# Patient Record
Sex: Female | Born: 1980 | Race: Black or African American | Hispanic: No | Smoking: Never smoker
Health system: Southern US, Community
[De-identification: ages and names within clinical notes are randomized; demographics above are authoritative.]

## PROBLEM LIST (undated history)

## (undated) DIAGNOSIS — N3281 Overactive bladder: Secondary | ICD-10-CM

## (undated) DIAGNOSIS — N393 Stress incontinence (female) (male): Secondary | ICD-10-CM

## (undated) DIAGNOSIS — N812 Incomplete uterovaginal prolapse: Secondary | ICD-10-CM

## (undated) DIAGNOSIS — J3089 Other allergic rhinitis: Secondary | ICD-10-CM

## (undated) DIAGNOSIS — Z803 Family history of malignant neoplasm of breast: Secondary | ICD-10-CM

## (undated) DIAGNOSIS — Z87440 Personal history of urinary (tract) infections: Secondary | ICD-10-CM

## (undated) DIAGNOSIS — F40240 Claustrophobia: Secondary | ICD-10-CM

## (undated) DIAGNOSIS — D259 Leiomyoma of uterus, unspecified: Secondary | ICD-10-CM

## (undated) DIAGNOSIS — N819 Female genital prolapse, unspecified: Secondary | ICD-10-CM

## (undated) DIAGNOSIS — N390 Urinary tract infection, site not specified: Secondary | ICD-10-CM

## (undated) HISTORY — DX: Urinary tract infection, site not specified: N39.0

---

## 2003-04-26 ENCOUNTER — Inpatient Hospital Stay (HOSPITAL_COMMUNITY): Admission: AD | Admit: 2003-04-26 | Discharge: 2003-04-26 | Payer: Self-pay | Admitting: Obstetrics and Gynecology

## 2003-11-29 ENCOUNTER — Other Ambulatory Visit: Admission: RE | Admit: 2003-11-29 | Discharge: 2003-11-29 | Payer: Self-pay | Admitting: Obstetrics and Gynecology

## 2004-05-03 ENCOUNTER — Inpatient Hospital Stay (HOSPITAL_COMMUNITY): Admission: AD | Admit: 2004-05-03 | Discharge: 2004-05-04 | Payer: Self-pay | Admitting: *Deleted

## 2004-12-08 HISTORY — PX: TUBAL LIGATION: SHX77

## 2005-01-09 ENCOUNTER — Encounter: Payer: Self-pay | Admitting: Emergency Medicine

## 2005-01-10 ENCOUNTER — Ambulatory Visit (HOSPITAL_COMMUNITY): Admission: AD | Admit: 2005-01-10 | Discharge: 2005-01-10 | Payer: Self-pay | Admitting: Obstetrics and Gynecology

## 2005-01-10 ENCOUNTER — Encounter (INDEPENDENT_AMBULATORY_CARE_PROVIDER_SITE_OTHER): Payer: Self-pay | Admitting: *Deleted

## 2005-01-10 DIAGNOSIS — Z8759 Personal history of other complications of pregnancy, childbirth and the puerperium: Secondary | ICD-10-CM

## 2005-01-10 HISTORY — DX: Personal history of other complications of pregnancy, childbirth and the puerperium: Z87.59

## 2005-01-10 HISTORY — PX: DIAGNOSTIC LAPAROSCOPY WITH REMOVAL OF ECTOPIC PREGNANCY: SHX6449

## 2005-01-22 ENCOUNTER — Other Ambulatory Visit: Admission: RE | Admit: 2005-01-22 | Discharge: 2005-01-22 | Payer: Self-pay | Admitting: Obstetrics and Gynecology

## 2006-04-26 ENCOUNTER — Inpatient Hospital Stay (HOSPITAL_COMMUNITY): Admission: AD | Admit: 2006-04-26 | Discharge: 2006-04-26 | Payer: Self-pay | Admitting: Obstetrics and Gynecology

## 2006-05-05 ENCOUNTER — Inpatient Hospital Stay (HOSPITAL_COMMUNITY): Admission: AD | Admit: 2006-05-05 | Discharge: 2006-05-07 | Payer: Self-pay | Admitting: Obstetrics and Gynecology

## 2006-05-06 ENCOUNTER — Encounter (INDEPENDENT_AMBULATORY_CARE_PROVIDER_SITE_OTHER): Payer: Self-pay | Admitting: *Deleted

## 2006-05-06 HISTORY — PX: TUBAL LIGATION: SHX77

## 2006-05-12 ENCOUNTER — Inpatient Hospital Stay (HOSPITAL_COMMUNITY): Admission: AD | Admit: 2006-05-12 | Discharge: 2006-05-14 | Payer: Self-pay | Admitting: Obstetrics and Gynecology

## 2006-06-16 ENCOUNTER — Other Ambulatory Visit: Admission: RE | Admit: 2006-06-16 | Discharge: 2006-06-16 | Payer: Self-pay | Admitting: Obstetrics and Gynecology

## 2008-08-03 ENCOUNTER — Encounter: Admission: RE | Admit: 2008-08-03 | Discharge: 2008-08-03 | Payer: Self-pay | Admitting: Obstetrics & Gynecology

## 2009-08-23 ENCOUNTER — Encounter: Admission: RE | Admit: 2009-08-23 | Discharge: 2009-08-23 | Payer: Self-pay | Admitting: Obstetrics & Gynecology

## 2010-03-22 ENCOUNTER — Emergency Department (HOSPITAL_COMMUNITY): Admission: EM | Admit: 2010-03-22 | Discharge: 2010-03-22 | Payer: Self-pay | Admitting: Emergency Medicine

## 2010-05-27 LAB — HM PAP SMEAR: HM Pap smear: NORMAL

## 2010-08-26 ENCOUNTER — Encounter: Admission: RE | Admit: 2010-08-26 | Discharge: 2010-08-26 | Payer: Self-pay | Admitting: Obstetrics & Gynecology

## 2011-04-25 NOTE — Op Note (Signed)
Jamie Meza, FORMAN             ACCOUNT NO.:  [id number]   MEDICAL RECORD NO.:  [id number]          PATIENT TYPE:  MAT   LOCATION:  MATC                          [facility]   PHYSICIAN:  Hal Morales, M.D.DATE OF BIRTH:  Jun 09, 1981   DATE OF PROCEDURE:  01/10/2005  DATE OF DISCHARGE:                                 OPERATIVE REPORT   PREOPERATIVE DIAGNOSIS:  Rule out ectopic pregnancy.   POSTOPERATIVE DIAGNOSIS:  The left tubal ectopic pregnancy, status post  tubal abortion.   PROCEDURE:  Operative laparoscopy with removal of left tubal ectopic  pregnancy and removal of hemoperitoneum.   SURGEON:  Hal Morales, M.D.   ANESTHESIA:  General orotracheal.   ESTIMATED BLOOD LOSS:  Less than 50 mL.   COMPLICATIONS:  None.   FINDINGS:  The uterus, right tube and ovary appeared within normal limits.  The left tube was dilated at the fimbriated end to approximately 3 cm.  In  the cul-de-sac was approximately 250 cc of old blood and once that blood had  been successfully removed, a 4 cm mass consistent with ectopic pregnancy was  present.   PROCEDURE:  The patient was taken to the operating room after appropriate  identification and placed on the operating table.  After the attainment of  adequate general anesthesia, the patient was placed in modified the  lithotomy position.  The abdomen, perineum and vagina were prepped with  multiple layers of Betadine. A Foley catheter was inserted into the bladder  and connected to straight drainage.  A single-tooth tenaculum was placed on  the anterior cervix.  The abdomen was draped as a sterile field.  Subumbilical and suprapubic injection of 0.25% Marcaine for total of 10 mL  was undertaken.  A subumbilical incision was made and a Veress cannula  placed through that incision into the perineal cavity.  A pneumoperitoneum  was created with 3.5 L of CO2.  The Veress cannula was removed and the  laparoscopic trocar placed  through that incision into the perineal cavity.  Incisions were made to the right and left of midline suprapubically and  laparoscopic trocars placed through those incisions into the peritoneal  cavity under direct visualization.  The above-noted findings were made and  documented.  The dilated portion of the tube was initially thought to  contain the ectopic and was therefore infiltrated with a dilute solution of  Pitressin then incised.  It was then that it was realized that this  represented a hydrosalpinx, and monopolar cautery was used to achieve  hemostasis in the cut edge.  Suction in the posterior cul-de-sac in an  attempt to remove more of the hemoperitoneum revealed a 4 cm mass in the  posterior cul-de-sac, which would be consistent with products of products of  conception and evidence of a probable tubal abortion.  This mass  was then  placed in an EndoCatch bag and removed through the subumbilical port.  Hemostasis was noted to be adequate after copious irrigation, and all  instruments were removed from the peritoneal cavity under direct  visualization.  The subumbilical incision was closed  with a fascial suture  of 0 Vicryl.  Subcuticular sutures of 3-0 Vicryl were used to close the skin  incisions on the subumbilical and suprapubic incisions.  Sterile dressings  were applied and the patient was awakened  from general anesthesia after having the tenaculum and Foley catheter  removed.  She was taken to the recovery room in satisfactory condition  having tolerated the procedure well, with sponge and instrument counts  correct.   SPECIMENS TO PATHOLOGY:  Products of conception.      VPH/MEDQ  D:  01/10/2005  T:  01/10/2005  Job:  [id number]

## 2011-04-25 NOTE — H&P (Signed)
NAME:  CABELA, PACIFICO                ACCOUNT NO.:  [id number]   MEDICAL RECORD NO.:  [id number]          PATIENT TYPE:  INP   LOCATION:  9169                          [facility]   PHYSICIAN:  Naima A. Dillard, M.D. DATE OF BIRTH:  [birth year]   DATE OF ADMISSION:  05/05/2006  DATE OF DISCHARGE:                                HISTORY & PHYSICAL   HISTORY:  This is a 30 year old gravida 5, para 2-0-3-2, at 37-5/7 weeks who  presents with rupture of membranes at 6:30 a.m. with onset of contractions  more recently.  She reports positive fetal movement.  Pregnancy has been  followed by the nurse midwife service and remarkable for:  1.  History of early cervical dilation.  2.  History of cryosurgery.  3.  Rapid labor.  4.  Family history of breast cancer.  5.  Desires BTL.   ALLERGIES:  None.   OB HISTORY:  The patient had a vaginal delivery in 1995, of a female infant  at 107 weeks gestation, weighing 6 pounds 3 ounces.  Remarkable for a 1-hour  labor.  She had an elective abortion in 2003, at [redacted] weeks gestation.  She had  a vaginal delivery in 2005, of a female infant at [redacted] weeks gestation,  weighing 7 pounds 11 ounces.  Remarkable for a 6-hour labor.  She had an  ectopic in 2006, with surgical removal.   MEDICAL HISTORY:  Remarkable for history of anemia, history of premature  cervical dilation with her first pregnancy, history of abnormal Pap.  She  also had varicella.   SURGICAL HISTORY:  Remarkable for wisdom teeth in 2001, cryosurgery in 2000.   FAMILY HISTORY:  Remarkable for grandfather with heart disease; grandmother  with diabetes, renal failure, and rheumatoid arthritis.  Mother with breast  cancer at age 39 and two aunts with breast cancer.   GENETIC HISTORY:  Remarkable for father of baby's son with an extra digit.   SOCIAL HISTORY:  The patient is married to a man who is involved and  supportive.  She is of the [employer].  She denies any alcohol,  tobacco, or  drug use.   PRENATAL LABS:  Hemoglobin 11.6, platelets 259.  Blood type A positive.  Antibody screen negative.  Sickle cell negative.  RPR nonreactive.  Rubella  immune.  Hepatitis negative.  Pap test normal.  Gonorrhea negative.  Chlamydia negative.  Cystic fibrosis negative.   HISTORY OF CURRENT PREGNANCY:  The patient entered care at [redacted] weeks  gestation.  She had an ultrasound to confirm dates at that time.  The  patient had ultrasound for anatomy at 19 weeks which was normal.  She had  Glucola which was normal at 28 weeks.  She saw Dr. Normand Sloop to discuss tubal  ligation at 32 weeks and had a group B strep which was positive at term.   ASSESSMENT:  1.  Intrauterine pregnancy at 37-5/7 weeks.  2.  Spontaneous rupture of membranes.  3.  Active labor.  4.  Group B strep positive.   PLAN:  1.  Admit to birthing  suite, Dr. Normand Sloop notified.  2.  Routine CNM orders.  3.  The patient wants epidural.  4.  Penicillin prophylaxis.      Marie L. Williams, C.N.M.      Naima A. Normand Sloop, M.D.  Electronically Signed    MLW/MEDQ  D:  05/05/2006  T:  05/05/2006  Job:  [id number]

## 2011-04-25 NOTE — Discharge Summary (Signed)
NAME:  Jamie Meza, Jamie Meza                ACCOUNT NO.:  [id number]   MEDICAL RECORD NO.:  [id number]          PATIENT TYPE:  INP   LOCATION:  9161                          [facility]   PHYSICIAN:  Dois Davenport A. Rivard, M.D. DATE OF BIRTH:  19-Jan-1981   DATE OF ADMISSION:  05/12/2006  DATE OF DISCHARGE:  05/14/2006                                 DISCHARGE SUMMARY   ADMISSION DIAGNOSES:  1.  Seven days status post spontaneous vaginal birth.  2.  Postpartum pregnancy-induced hypertension.   DISCHARGE DIAGNOSES:  1.  Nine days status post spontaneous vaginal birth.  2.  Preeclampsia.   PROCEDURE:  Magnesium sulfate therapy.   HOSPITAL COURSE:  Ms. Handley is a 30 year old gravida 5, para 3-0-2-3 at  seven days postpartum status post spontaneous vaginal birth who presented on  the evening of May 12, 2006 after the [employer] nurse had seen her that  day and taken a blood pressure of 150/98.  The patient also reported a  headache.  She had no visual symptoms or significant epigastric pain.  She  had had no history of hypertension in this or any previous pregnancy.  Her  pregnancy had been remarkable for (1) tubal ligation on May 06, 2006, (2)  spontaneous vaginal birth on May 05, 2006, (3) positive group B strep, (4)  history of cryosurgery.   PHYSICAL EXAMINATION:  VITAL SIGNS:  On admission, her blood pressures were  systolic 154 to 160 over diastolics 83 to 99.  Baseline during her pregnancy  was 110/60.  Other vital signs were stable.  Her weight was 200 pounds.  She  had been 206 on May 05, 2006.  Her physical exam was within normal limits.  EXTREMITIES:  She had 1 to 2+ edema in her lower extremities.   LABORATORY DATA:  CBC and comprehensive basic metabolic panel were within  normal limits.   HOSPITAL COURSE:  The decision was made to admit the patient for magnesium  sulfate therapy in light of her elevated blood pressure and headache.  She  did have negative urine protein on a  voided specimen at the time of  admission.  Hydrochlorothiazide 25 mg p.o. daily also begun.  A 24-hour  urine was also started.   On the morning of May 13, 2006, the patient was feeling somewhat better.  She had been on magnesium through the night.  She did have some mild  persistent frontal headache.  Urine output was essentially equal to intake  which demonstrated no significant diuresis.  Her CBC and comprehensive  metabolic panel that morning were within normal limits.  Later that evening,  24-hour urine sample reports came back showing 819 mg of protein in a 24-  hour specimen.  By the morning of May 14, 2006, the patient had maintained a  weight of 200.  She was pumping for breast milk.  Her 24-hour intake and  output balance had been -730; however, the last shift it had been +520.  Dr.  Estanislado Pandy saw the patient.  The patient was continued to be monitored during  the rest of  the day.  She did begin to diurese better that afternoon, and by  5 p.m. had had an increase in her urine output to 300 to 500 cc per hour.  Her blood pressures also had improved, with blood pressures of 118/83,  126/74, and 114/66.  She denied any headache or visual symptoms.  Dr. Estanislado Pandy  was consulted, and the decision was made to discharge the patient home.   DISCHARGE INSTRUCTIONS:  The patient is to continue to implement discharge  instructions per [facility] handout.  She also was instructed on  signs and symptoms of worsening PIH and preeclampsia.   DISCHARGE MEDICATIONS:  1.  Vicodin one to two p.o. q.3-4h. p.r.n. pain.  2.  Hydrochlorothiazide 25 mg one p.o. daily.   FOLLOW UP:  Discharge followup will occur on May 19, 2006 at [facility] at 10 a.m. for a blood pressure check with Nigel Bridgeman,  Certified Nurse Midwife.      [name], C.N.M.      Crist Fat Rivard, M.D.  Electronically Signed    VLL/MEDQ  D:  05/14/2006  T:  05/15/2006  Job:  [id number]

## 2011-04-25 NOTE — Op Note (Signed)
NAME:  Jamie Meza, CROKE                ACCOUNT NO.:  [id number]   MEDICAL RECORD NO.:  [id number]          PATIENT TYPE:  INP   LOCATION:  9106                          [facility]   PHYSICIAN:  Crist Fat. Rivard, M.D. DATE OF BIRTH:  09-12-1981   DATE OF PROCEDURE:  05/06/2006  DATE OF DISCHARGE:                                 OPERATIVE REPORT   PREOPERATIVE DIAGNOSIS:  Desire for sterilization.   POSTOPERATIVE DIAGNOSIS:  Desire for sterilization.   ANESTHESIA:  Epidural.   PROCEDURE:  Postpartum bilateral tubal ligation.   SURGEON:  Crist Fat. Rivard, M.D.   ESTIMATED BLOOD LOSS:  Minimal.   PROCEDURE:  After being informed of the planned procedure with possible  complications including bleeding, infection, injury to other organs,  irreversibility and failure rate of 1 in 500, informed consent is obtained.  The patient is taken to OR #8 and given epidural anesthesia with the  previously-placed epidural catheter.  She is prepped and draped in a sterile  fashion and her bladder is emptied with an in-and-out Foley catheter.  After  assessing adequate level of anesthesia, we infiltrate the umbilical area  using 10 mL of Marcaine 0.25% and we removed the previous umbilical  incision, which was a keloid, sharply.  This is brought down to the fascia  and the fascia is opened bluntly.  We are in the abdominal cavity.  We  identify each tube and tie each tube the same way.  The tube is grasped with  a Babcock forceps until fimbriae are exteriorized.  Mesosalpinx is entered  with cautery.  Proximal stump is doubly ligated with chromic, distal stump  is doubly ligated with 0 chromic.  A section of 1.5 cm of isthmic-ampullary  tube is removed and both stumps are then cauterized.  Hemostasis is checked  and adequate.  The fascia is then closed with a running suture of 0 Vicryl  and skin is closed with subcuticular suture of 3-0 Monocryl and Steri-  Strips.  Instrument and sponge count is  complete x2.  Estimated blood loss  is minimal.  The procedure is very well-tolerated by the patient, who is  taken to recovery room in a well and stable condition.      Crist Fat Rivard, M.D.  Electronically Signed     SAR/MEDQ  D:  05/06/2006  T:  05/06/2006  Job:  [id number]

## 2011-04-25 NOTE — H&P (Signed)
Jamie Meza, Jamie Meza                         ACCOUNT NO.:  [id number]   MEDICAL RECORD NO.:  [id number]                   PATIENT TYPE:  INP   LOCATION:  9176                                 [facility]   PHYSICIAN:  Hal Morales, M.D.             DATE OF BIRTH:  18-Mar-1981   DATE OF ADMISSION:  05/03/2004  DATE OF DISCHARGE:                                HISTORY & PHYSICAL   Ms. Jamie Meza is a 30 year old gravida 4, para 1, 0 2 1 at 61 and 4/7 weeks  who presents with uterine contractions every 3-6 minutes for several hours.  She denies leaking or bleeding and reports positive fetal movement.  Pregnancy has been remarkable for:  1. History of preterm labor with 1995 pregnancy, dilated to 6 cm at 32 weeks     with term delivery.  2. Two TABs but only one identified on her [facility] records, 2     identified on her women's health record.  3. History of cryosurgery.  4. History of rapid labor.  5. Strong family history of breast cancer.  6. Slightly late to care at 17 weeks at [facility], but had had one     visit at [facility] at 11 weeks.  7. Equivocal rubella.    PRENATAL LABORATORY STUDIES:  Blood type is A positive.  Rh antibody  negative.  VDRL non-reactive.  Rubella equivocal.  Hepatitis B surface  antigen negative.  HIV non-reactive.  Sickle cell test negative.  GC and  Chlamydia cultures were negative.  Pap was normal.  Glucose challenge was  normal.  AFP was normal.  Cystic fibrosis testing was negative.  Hemoglobin  upon entry into practice was 10.4.  It was 9.9 at 26 weeks.  EDC of  05/06/2004 was established by last menstrual period and was in agreement  with ultrasound at approximately 18 weeks.   HISTORY OF PRESENT PREGNANCY:  The patient entered care at [facility] at 17 weeks.  She had had one previous visit at [facility].  She had  had a history of early cervical dilatation with her first pregnancy with  dilatation to 6  cm at 32 weeks.  Therefore, after 24 weeks her cervix was  checked more frequently but never demonstrated any cervical change, although  the vertex did descend to a -1 station fairly early.  At 19 weeks, she had a  cervical length of 2.8 cm.  This did improve to 3.1 cm at 23 weeks.  Fetal  fibronectins were begun at approximately 28 weeks.  They all were negative.  The rest of her pregnancy was unremarkable.   OBSTETRICAL HISTORY:  In 1995, she had a vaginal infant that weighs 6  pounds, 3-1/2 ounces at 38 weeks.  She was in labor at 1 hour.  She had  vaginal delivery.  That child was born in [place].  She was dilated to  6 cm by 32 weeks but then delivered at term.  She was in the hospital for 2  weeks.  She also had anemia with that pregnancy.  In 2003, she had a 7-week  therapeutic termination of pregnancy.  In 2000, she also had a 7-week  termination.   MEDICAL HISTORY:  1. The patient stopped Ortho Evra in September 2004.  2. In 2000, she had an abnormal Pap had cryo with normal Paps ever since.  3. She had usual childhood illnesses.  4. UTI in the past.   ALLERGIES:  NO KNOWN MEDICAL ALLERGIES.   FAMILY HISTORY:  Maternal grandmother had congestive heart failure and a  stroke.  Maternal grandmother had diabetes.  Maternal grandmother had  diabetes.  Maternal grandmother had dialysis and rheumatoid arthritis.  Mother had breast cancer at approximately age 78.  Aunt had breast cancer in  47s.  Another aunt had breast cancer at age 12.   SURGICAL HISTORY:  Includes wisdom teeth removed in 2001.   GENETIC HISTORY:  Remarkable for father of the baby's son having an extra  digit.   SOCIAL HISTORY:  The patient is single.  The father of the baby is involved  and supportive.  His name is Jamie Meza.  The patient is high school  educated.  She is employed with [employer].  Her partner has 1 year of college.  He  is employed at [employer].  She has been followed by the certified  nurse  midwife service in [place].  She denies any alcohol, drug, or tobacco  use during this pregnancy.   PHYSICAL EXAMINATION:  VITAL SIGNS:  Stable.  The patient is afebrile.  HEENT:  Within normal limits.  LUNGS:  Breath sounds are clear.  HEART:  Regular rate and rhythm without murmur.  BREASTS:  Soft and nontender.  ABDOMEN:  Fundal height is approximately 38 cm.  Estimated fetal weight 7-8  pounds.  Uterine contractions are every 1-4 minutes, irregular in quality  and frequency.  Cervix was initially 3 cm, 100% vertex at a -1 to a 0  station.  After ambulation, it was 5 cm, 100% vertex at a 0 station with  bulging bag of water.  Fetal heart rate was reassuring with a negative  spontaneous CST.  EXTREMITIES:  Deep tendon reflexes are 2+ without clonus.  There is a trace  edema noted.   IMPRESSION:  1. Intrauterine pregnancy at 38 and 4/7 weeks.  2. Early labor.  3. History of rapid labor.  4. Equivocal rubella.   PLAN:  1. Admit to birthing suite with consult with Dr. Crist Fat. Rivard as current     oncoming physician.  2. Routine certified midwife orders.  3. Plan pain medication per patient desire.     Jamie Meza, C.N.M.                   Hal Morales, M.D.    Jamie Meza  D:  05/03/2004  T:  05/03/2004  Job:  [id number]

## 2011-04-25 NOTE — Discharge Summary (Signed)
NAME:  Jamie Meza, Jamie Meza                ACCOUNT NO.:  [id number]   MEDICAL RECORD NO.:  [id number]          PATIENT TYPE:  INP   LOCATION:  9106                          [facility]   PHYSICIAN:  Naima A. Dillard, M.D. DATE OF BIRTH:  31-Aug-1981   DATE OF ADMISSION:  05/05/2006  DATE OF DISCHARGE:  05/07/2006                                 DISCHARGE SUMMARY   ADMISSION DIAGNOSES:  1.  Intrauterine pregnancy at 37-5/7 weeks.  2.  Spontaneous rupture of membranes.  3.  Active labor.  4.  Desires elective sterilization.   DISCHARGE DIAGNOSES:  1.  Intrauterine pregnancy at 37-5/7 weeks.  2.  Spontaneous rupture of membranes.  3.  Active labor.  4.  Desires elective sterilization.  5.  Status post spontaneous vaginal delivery of a viable female infant named      Zebulon, Apgars 9 and 9, weighing 7 pounds 3 ounces.  6.  Nuchal cord x1.  7.  Status post bilateral tubal ligation.   HOSPITAL PROCEDURES:  1.  Epidural anesthesia.  2.  Spontaneous vaginal delivery.  3.  Postpartum bilateral tubal ligation.   HOSPITAL COURSE:  The patient was admitted with spontaneous rupture of  membranes in active labor.  Her cervix was 5 cm on admission.  She labored  quickly and delivered vaginally without any problems a female infant named  Zebulon over intact perineum.  Apgars were 7 and 9.  Weight was 7 pounds 3  ounces.  Nuchal cord was reduced.  There was no trouble with the shoulders  and the placenta was delivered spontaneously without problems.  The patient  was requesting bilateral tubal ligation which was performed under epidural  anesthesia on postpartum day #1 with no problems.  EBL was minimal.  On  postpartum day #2, the patient was doing well and ready to go home.  Vital  signs were stable.  Chest was clear.  Heart rate regular rate and rhythm.  Abdomen soft and appropriately tender.  Dressing was clean, dry and intact  at umbilicus.  Lochia was within normal limits.  Extremities within  normal  limits.  She was deemed to have received full benefit of her hospital stay  and was discharged home.   DISCHARGE MEDICATIONS:  1.  Motrin 600 mg p.o. q.6 h p.r.n.  2.  Tylenol 1-2 p.o. q.4 h p.r.n.   DISCHARGE LABS:  White blood cell count 11.8, hemoglobin 9.6, platelet count  237.   DISCHARGE INSTRUCTIONS:  Per [facility] handout.   DISCHARGE FOLLOWUP:  In six weeks or p.r.n.      Marie L. Williams, C.N.M.      Naima A. Normand Sloop, M.D.  Electronically Signed    MLW/MEDQ  D:  05/07/2006  T:  05/07/2006  Job:  [id number]

## 2011-04-25 NOTE — H&P (Signed)
NAME:  IDONA, STACH                ACCOUNT NO.:  [id number]   MEDICAL RECORD NO.:  [id number]          PATIENT TYPE:  MAT   LOCATION:  [facility]                          FACILITY:  [facility]   PHYSICIAN:  Naima A. Dillard, M.D. DATE OF BIRTH:  1981/07/15   DATE OF ADMISSION:  05/12/2006  DATE OF DISCHARGE:                                HISTORY & PHYSICAL   Ms. Bawa is a 30 year old, gravida 5, para 3-0-2-3, at 7 days status post  spontaneous vaginal birth who presented with a 24-hour history of headache.  Blood pressure was 150/98 today by the smart chart nurse.  The patient also  reported increased swelling over the last few days, although this was  somewhat better today.  She denies any visual symptoms. She had a  questionable slight right upper quadrant pain last night, but that has  resolved now.  She denies any nausea or vomiting. The patient has no history  of hypertension in this or any previous pregnancy.  History has been  remarkable for:  1.  Spontaneous vaginal birth on May 25. No lacerations and no prenatal      complications were noted.  2.  She had a tubal sterilization on May 06, 2006.  3.  Positive group B strep.  4.  History of cryosurgery.   LABORATORY DATA:  CBC today shows hemoglobin 9.8, hematocrit 29.5, white  blood cell count 9.8, and platelets 343.  Clean catch urine shows negative  protein.  Comprehensive metabolic panel: Sodium 141, potassium 3.8, chloride  112, carbon dioxide 24, glucose 96, BUN 12, creatinine 0.6, SGOT 25, SGPT  26.  LDH 239.  Uric acid 5.3.   OBSTETRICAL HISTORY:  The patient had a vaginal delivery in 1995 of a female  infant at [redacted] weeks gestation, weighing 6 pounds 3 ounces.  She did have a  rapid labor that lasted 1 hour.  She had a TAB in 2003 at [redacted] weeks gestation.  She had a vaginal delivery in 2005 of a female infant at [redacted] weeks gestation,  weighing 7 pounds 11 ounces.  Remarkable for 6-hour labor.  She did have an  ectopic in 2006 with  surgical removal.   PAST MEDICAL HISTORY:  1.  History of anemia.  2.  History of premature cervical dilatation with her first pregnancy.  3.  History of an abnormal Pap.  4.  She reports the usual childhood illnesses.   PAST SURGICAL HISTORY:  1.  Wisdom teeth removed in 2001.  2.  Crown surgery in 2000.  3.  Surgery for an ectopic in 2006.   FAMILY HISTORY:  Grandfather with heart disease, grandmother with diabetes,  renal failure, rheumatoid arthritis.  Her mother had breast cancer at age  17, and she had 2 aunts with breast cancer.   GENETIC HISTORY:  Remarkable for the father of the baby born with an extra  digit.   SOCIAL HISTORY:  The patient is married to the father of the baby.  He is  involved and supportive.  His name is Melvia Heaps.  The patient lives at home  with her children, her 2 older children and her newborn.  She is of the  [employer].  She is African-American in ethnicity.  She denies any  alcohol, drug, or tobacco use during this pregnancy or subsequently.   PRENATAL LABORATORY DATA:  Blood type A positive.  Antibody screen negative.  Sickle cell negative.  RPR nonreactive.  Rubella immune.  Hepatitis  negative.  Pap smear was normal.  GC and chlamydia cultures were negative in  the first trimester.  Cystic fibrosis testing was negative.   HISTORY OF PREGNANCY:  The patient entered care at 7 weeks.  She had an  uncomplicated pregnancy.  She did request tubal ligation.  Her blood  pressure remained in the 110 to 60 range.   ALLERGIES:  PERCOCET which causes some itching.  She can take Vicodin or  Darvocet.   PHYSICAL EXAMINATION:  VITAL SIGNS: Blood pressure 154 to 160 systolic, 83  to 99 diastolic.  Other vital signs are stable.  O2 saturation 99%.  Pulse  55 to 68.  Weight is 200 with weight being 206 on May 05, 2006, at the time  of her admission for delivery.  CHEST: Clear.  HEART:  Regular rate and rhythm without murmur.  BREASTS:  Soft and  nontender.  The patient is lactating.  ABDOMEN: Fundal height is approximately 14-week size and nontender.  Lochia  scant.  EXTREMITIES:  Deep tendon reflexes 1+ without clonus.  There is 1 to 2+  edema in the lower extremities.   ASSESSMENT:  1.  Seven days status post spontaneous vaginal birth.  2.  Postpartum pregnancy-induced hypertension.   PLAN:  1.  Admit to [facility] for consult with Dr. Normand Sloop as      attending physician.  2.  Magnesium sulfate 4 g bolus and 2 g per hour.  3.  Hydrochlorothiazide 25 mg 1 p.o. daily.  4.  Repeat PIH labs in the morning.  5.  Twenty-four-hour urine.  6.  Will use Vicodin for headache.  7.  High-protein diet.      [name], C.N.M.      Naima A. Normand Sloop, M.D.  Electronically Signed    VLL/MEDQ  D:  05/12/2006  T:  05/12/2006  Job:  [id number]

## 2011-05-23 ENCOUNTER — Encounter: Payer: Self-pay | Admitting: Family

## 2011-05-23 ENCOUNTER — Ambulatory Visit (INDEPENDENT_AMBULATORY_CARE_PROVIDER_SITE_OTHER): Payer: BC Managed Care – PPO | Admitting: Family

## 2011-05-23 DIAGNOSIS — R519 Headache, unspecified: Secondary | ICD-10-CM | POA: Insufficient documentation

## 2011-05-23 DIAGNOSIS — R51 Headache: Secondary | ICD-10-CM

## 2011-05-23 DIAGNOSIS — H9201 Otalgia, right ear: Secondary | ICD-10-CM

## 2011-05-23 DIAGNOSIS — H9209 Otalgia, unspecified ear: Secondary | ICD-10-CM

## 2011-05-23 NOTE — Assessment & Plan Note (Signed)
Recommend Motrin when necessary also recommended that she have a routine eye examination, and add Claritin. We'll reevaluate her followup appointment this summer for her physical.

## 2011-05-23 NOTE — Patient Instructions (Signed)
Please follow up this summer fasting for a physical.

## 2011-05-23 NOTE — Assessment & Plan Note (Signed)
She does note some symptoms of allergic rhinitis, and she is known to have bilateral serous effusions, clinical data not appear to be infected. I have advised her to start Claritin 10 mg by mouth daily and to contact us should pain worsen or she develops fever.

## 2011-05-23 NOTE — Progress Notes (Signed)
  Subjective:    Patient ID: Jamie Meza, female    DOB: Mar 30, 1981, 30 y.o.   MRN: [id number]  HPI  Referred by Jamie Meza.   Jamie Meza is a 30 yr old female who presents today to establish care.  She reports that she has "slacked off." Eating all the wrong things.   Exercise- treadmill, stairmaster- not exercising recently.    Otalgia- reports that she had an ear infection about 1 yr ago.  Has some aching in the right ear.  Sometimes pain- just to touch.  Denies fevers.    UTI's- usually occur once a year.    HA's every 2 days- denies associated nausea.  Occasional photophobia.  Up to date on pap smear.     Review of Systems  Constitutional: Negative for fever.       20 lb weight gain in the last 2 yrs.  HENT: Positive for ear pain. Negative for ear discharge.   Eyes: Negative for visual disturbance.  Genitourinary: Negative for dysuria, frequency, hematuria and menstrual problem.  Musculoskeletal: Negative for arthralgias.  Neurological: Positive for headaches.  Psychiatric/Behavioral:       Denies depression/anxiety   Past Medical History  Diagnosis Date  . Recurrent UTI     occur yearly    History   Social History  . Marital Status: Married    Spouse Name: N/A    Number of Children: N/A  . Years of Education: N/A   Occupational History  . Not on file.   Social History Main Topics  . Smoking status: Never Smoker   . Smokeless tobacco: Not on file  . Alcohol Use: No  . Drug Use: No  . Sexually Active: Not on file   Other Topics Concern  . 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    Past Surgical History  Procedure Date  . Tubal ligation 2006    Family History  Problem Relation Age of Onset  . Breast cancer Mother   . Breast cancer Maternal Aunt      2 of her mother sisters  . Prostate cancer  Maternal Uncle     2 of her mother brothers  . Stroke Maternal Grandmother   . Diabetes Maternal Grandmother     No Known Allergies  No current outpatient prescriptions on file prior to visit.    BP 100/60  Pulse 72  Temp(Src) 97.8 F (36.6 C) (Oral)  Resp 16  Ht 5\' 5"  (1.651 m)  Wt 178 lb (80.74 kg)  BMI 29.62 kg/m2  SpO2 100%  LMP 05/04/2011       Objective:   Physical Exam General: Awake, alert, pleasant female in no acute distress Neck: Supple without cervical lymphadenopathy or masses noted Eyes: Pupils equal round reactive to light and accommodation, sclera are clear without injection Ears: Bilateral tympanic membranes intact with serous effusions bilaterally no erythema or bulging is noted Throat: Oropharynx without erythema or exudates Cardiovascular S1, S2, regular rate and rhythm Respiratory: Breath sounds clear to auscultation bilaterally without wheezes rales or rhonchi Skin: No lesions are noted no rashes  Psychiatric and O. X3, pleasant       Assessment & Plan:

## 2011-06-24 ENCOUNTER — Ambulatory Visit (INDEPENDENT_AMBULATORY_CARE_PROVIDER_SITE_OTHER): Payer: BC Managed Care – PPO | Admitting: Family

## 2011-06-24 ENCOUNTER — Encounter: Payer: Self-pay | Admitting: Family

## 2011-06-24 DIAGNOSIS — Z0000 Encounter for general adult medical examination without abnormal findings: Secondary | ICD-10-CM | POA: Insufficient documentation

## 2011-06-24 LAB — CBC WITH DIFFERENTIAL/PLATELET
Basophils Absolute: 0 10*3/uL (ref 0.0–0.1)
Basophils Relative: 0 % (ref 0–1)
Eosinophils Relative: 2 % (ref 0–5)
HCT: 35.3 % — ABNORMAL LOW (ref 36.0–46.0)
MCHC: 32.3 g/dL (ref 30.0–36.0)
MCV: 91.7 fL (ref 78.0–100.0)
Monocytes Absolute: 0.3 10*3/uL (ref 0.1–1.0)
RDW: 13.7 % (ref 11.5–15.5)

## 2011-06-24 NOTE — Assessment & Plan Note (Signed)
Patient was commended on her exercise regimen and weight loss.  I encouraged her to continue the good work.  Immunizations reviewed and up to date.  Pap/mammo up to date and managed by GYN. Plan to check fasting labs today.

## 2011-06-24 NOTE — Patient Instructions (Signed)
Please schedule your Pap smear with Dr. Seymour Bars. Complete fasting lab work on the first floor today. Keep up the good work with diet, exercise and weight loss. Have a nice summer.

## 2011-06-24 NOTE — Progress Notes (Signed)
Subjective:    Patient ID: Jamie Meza, female    DOB: 1981-08-27, 30 y.o.   MRN: [id number]  HPI  Preventative- exercises 5x a week (treadmill/stairmaster).  Diet is good.  She has lost 5 pounds since her last visit.  She reports that her last tetanus shot was 2007. Pap smear Dr. Seymour Bars.  Due for pap.  She has annual mammogram yearly (mom diagnosed at 21 and two aunts had breast cancer one at age 59, one at age 32)   Review of Systems  Constitutional: Negative for fever.  HENT: Negative for hearing loss.   Eyes: Negative for visual disturbance.  Respiratory: Negative for shortness of breath.   Cardiovascular: Negative for chest pain and leg swelling.  Gastrointestinal: Negative for nausea, vomiting and diarrhea.  Genitourinary: Negative for dysuria, urgency and hematuria.       Periods have been irregular  Musculoskeletal: Negative for myalgias, back pain and arthralgias.  Skin: Negative for rash.       Denies concerning moles  Neurological: Negative for weakness and numbness.  Hematological: Negative for adenopathy.  Psychiatric/Behavioral:       Denies depression or anxiety.     Past Medical History  Diagnosis Date  . Recurrent UTI     occur yearly    History   Social History  . Marital Status: Married    Spouse Name: N/A    Number of Children: N/A  . Years of Education: N/A   Occupational History  . Not on file.   Social History Main Topics  . Smoking status: Never Smoker   . Smokeless tobacco: Not on file  . Alcohol Use: No  . Drug Use: No  . Sexually Active: Not on file   Other Topics Concern  . 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    Past Surgical History  Procedure Date  . Tubal ligation 2006    Family History  Problem Relation Age of Onset  . Breast cancer Mother   . Breast cancer Maternal Aunt       2 of her mother sisters  . Prostate cancer Maternal Uncle     2 of her mother brothers  . Stroke Maternal Grandmother   . Diabetes Maternal Grandmother     No Known Allergies  Current Outpatient Prescriptions on File Prior to Visit  Medication Sig Dispense Refill  . loratadine (CLARITIN) 10 MG tablet Take 10 mg by mouth.          BP 98/72  Pulse 72  Temp(Src) 98.2 F (36.8 C) (Oral)  Resp 16  Ht 5\' 5"  (1.651 m)  Wt 173 lb (78.472 kg)  BMI 28.79 kg/m2  LMP 06/02/2011       Objective:   Physical Exam  Constitutional: She is oriented to person, place, and time. She appears well-developed and well-nourished.  HENT:  Head: Normocephalic and atraumatic.  Eyes: Pupils are equal, round, and reactive to light.  Neck: Normal range of motion. Neck supple. No JVD present. No tracheal deviation present. No thyromegaly present.  Cardiovascular: Normal rate and regular rhythm.  Exam reveals no gallop and no friction rub.   No murmur heard. Pulmonary/Chest: Effort normal and breath sounds normal. No respiratory distress. She has no wheezes. She has no rales. She exhibits no tenderness.  Abdominal: Soft. Bowel sounds are normal.  Genitourinary:       Breast exam normal, no masses noted.  Musculoskeletal: Normal range of motion. She exhibits no edema and no tenderness.  Lymphadenopathy:    She has no cervical adenopathy.  Neurological: She is alert and oriented to person, place, and time. No cranial nerve deficit.  Skin: Skin is warm and dry. No rash noted. No erythema. No pallor.  Psychiatric: She has a normal mood and affect. Her behavior is normal. Judgment and thought content normal.          Assessment & Plan:

## 2011-06-25 LAB — LIPID PANEL: Cholesterol: 145 mg/dL (ref 0–200)

## 2011-06-25 LAB — TSH: TSH: 2.422 u[IU]/mL (ref 0.350–4.500)

## 2011-06-25 LAB — BASIC METABOLIC PANEL WITH GFR
BUN: 11 mg/dL (ref 6–23)
Chloride: 105 mEq/L (ref 96–112)
GFR, Est Non African American: >60 mL/min (ref >60)
Glucose, Bld: 77 mg/dL (ref 70–99)
Potassium: 4.4 mEq/L (ref 3.5–5.3)

## 2011-06-25 LAB — HEPATIC FUNCTION PANEL
ALT: 14 U/L (ref 0–35)
Albumin: 4.7 g/dL (ref 3.5–5.2)
Total Protein: 7.6 g/dL (ref 6.0–8.3)

## 2011-06-26 ENCOUNTER — Telehealth: Payer: Self-pay | Admitting: Family

## 2011-06-26 DIAGNOSIS — D649 Anemia, unspecified: Secondary | ICD-10-CM

## 2011-06-26 MED ORDER — MULTI-VITAMIN/MINERALS PO TABS: 1.0000 | ORAL_TABLET | Freq: Every day | ORAL | Status: AC

## 2011-06-26 NOTE — Telephone Encounter (Signed)
Please call pt and let her know that she is mildly anemic.  I would like her to start MVI with minerals once daily.  F/U cbc in 6 months. Diagnosis anemia.

## 2011-06-27 NOTE — Telephone Encounter (Signed)
Pt notified and voices understanding. Lab order entered and forwarded to the lab. Lab appt reminder mailed to pt.

## 2011-09-10 ENCOUNTER — Other Ambulatory Visit: Payer: Self-pay | Admitting: Obstetrics & Gynecology

## 2011-09-10 DIAGNOSIS — Z1231 Encounter for screening mammogram for malignant neoplasm of breast: Secondary | ICD-10-CM

## 2011-09-23 ENCOUNTER — Ambulatory Visit (INDEPENDENT_AMBULATORY_CARE_PROVIDER_SITE_OTHER): Payer: BC Managed Care – PPO | Admitting: Family

## 2011-09-23 ENCOUNTER — Encounter: Payer: Self-pay | Admitting: Family

## 2011-09-23 VITALS — BP 106/72 | HR 72 | Temp 97.8°F | Resp 16 | Ht 65.0 in | Wt 176.1 lb

## 2011-09-23 DIAGNOSIS — R300 Dysuria: Secondary | ICD-10-CM

## 2011-09-23 DIAGNOSIS — R3129 Other microscopic hematuria: Secondary | ICD-10-CM

## 2011-09-23 DIAGNOSIS — N390 Urinary tract infection, site not specified: Secondary | ICD-10-CM | POA: Insufficient documentation

## 2011-09-23 LAB — POCT URINALYSIS DIPSTICK
Leukocytes, UA: NEGATIVE
Protein, UA: NEGATIVE
Urobilinogen, UA: 0.2

## 2011-09-23 MED ORDER — CIPROFLOXACIN HCL 500 MG PO TABS: 500.0000 mg | ORAL_TABLET | Freq: Two times a day (BID) | ORAL | Status: AC

## 2011-09-23 NOTE — Progress Notes (Signed)
  Subjective:    Patient ID: Jamie Meza, female    DOB: July 11, 1981, 30 y.o.   MRN: [id number]  HPI  Jamie Meza is a 30 yr old female who presents today with chief complaint of dysuria.  Symptoms started 48 hours ago.  Has associated frequency, but denies associated fever, hematuria or low back pain.  + history of UTI's in the past. Review of Systems See HPI  Past Medical History  Diagnosis Date  . Recurrent UTI     occur yearly    History   Social History  . Marital Status: Married    Spouse Name: N/A    Number of Children: N/A  . Years of Education: N/A   Occupational History  . Not on file.   Social History Main Topics  . Smoking status: Never Smoker   . Smokeless tobacco: Not on file  . Alcohol Use: No  . Drug Use: No  . Sexually Active: Not on file   Other Topics Concern  . 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    Past Surgical History  Procedure Date  . Tubal ligation 2006    Family History  Problem Relation Age of Onset  . Breast cancer Mother   . Breast cancer Maternal Aunt      2 of her mother sisters  . Prostate cancer Maternal Uncle     2 of her mother brothers  . Stroke Maternal Grandmother   . Diabetes Maternal Grandmother     No Known Allergies  Current Outpatient Prescriptions on File Prior to Visit  Medication Sig Dispense Refill  . loratadine (CLARITIN) 10 MG tablet Take 10 mg by mouth as needed.       . Multiple Vitamins-Minerals (MULTIVITAMIN WITH MINERALS) tablet Take 1 tablet by mouth daily.  120 tablet  2    BP 106/72  Pulse 72  Temp(Src) 97.8 F (36.6 C) (Oral)  Resp 16  Ht 5\' 5"  (1.651 m)  Wt 176 lb 1.9 oz (79.888 kg)  BMI 29.31 kg/m2  LMP 09/13/2011        Objective:   Physical Exam  Constitutional: She appears well-developed and well-nourished.  Cardiovascular:  Normal rate and regular rhythm.   No murmur heard. Pulmonary/Chest: Effort normal and breath sounds normal. No respiratory distress. She has no wheezes. She has no rales. She exhibits no tenderness.  Abdominal: Soft.       Mild suprapubic tenderness to palpation.    Genitourinary:       Neg CVAT  Musculoskeletal: She exhibits no edema.  Skin: Skin is warm and dry.  Psychiatric: She has a normal mood and affect. Her behavior is normal. Judgment and thought content normal.          Assessment & Plan:

## 2011-09-23 NOTE — Patient Instructions (Signed)
Urinary Tract Infection (UTI)   Infections of the urinary tract can start in several places. A bladder infection (cystitis), a kidney infection (pyelonephritis), and a prostate infection (prostatitis) are different types of urinary tract infections. They usually get better if treated with medicines (antibiotics) that kill germs. Take all the medicine until it is gone. You or your child may feel better in a few days, but TAKE ALL MEDICINE or the infection may not respond and may become more difficult to treat.   HOME CARE INSTRUCTIONS   Drink enough water and fluids to keep the urine clear or pale yellow. Cranberry juice is especially recommended, in addition to large amounts of water.   Avoid caffeine, tea, and carbonated beverages. They tend to irritate the bladder.   Alcohol may irritate the prostate.   Only take over-the-counter or prescription medicines for pain, discomfort, or fever as directed by your caregiver.   FINDING OUT THE RESULTS OF YOUR TEST   Not all test results are available during your visit. If your or your child's test results are not back during the visit, make an appointment with your caregiver to find out the results. Do not assume everything is normal if you have not heard from your caregiver or the medical facility. It is important for you to follow up on all test results.   TO PREVENT FURTHER INFECTIONS:   Empty the bladder often. Avoid holding urine for long periods of time.   After a bowel movement, women should cleanse from front to back. Use each tissue only once.   Empty the bladder before and after sexual intercourse.   SEEK MEDICAL CARE IF:   There is back pain.   You or your child has an oral temperature above 101.   Your baby is older than 3 months with a rectal temperature of 100.5º F (38.1° C) or higher for more than 1 day.   Your or your child's problems (symptoms) are no better in 3 days. Return sooner if you or your child is getting worse.   SEEK IMMEDIATE MEDICAL CARE IF:    There is severe back pain or lower abdominal pain.   You or your child develops chills.   You or your child has an oral temperature above 101, not controlled by medicine.   Your baby is older than 3 months with a rectal temperature of 102º F (38.9º C) or higher.   Your baby is 3 months old or younger with a rectal temperature of 100.4º F (38º C) or higher.   There is nausea or vomiting.   There is continued burning or discomfort with urination.   MAKE SURE YOU:   Understand these instructions.   Will watch this condition.   Will get help right away if you or your child is not doing well or gets worse.   Document Released: 09/03/2005 Document Re-Released: 02/18/2010   ExitCare® Patient Information ©2011 [employer].

## 2011-09-23 NOTE — Assessment & Plan Note (Signed)
Will plan to treat with cipro.  Pt is instructed to call if symptoms worsen or if not improved in 2-3 days.

## 2011-09-24 ENCOUNTER — Telehealth: Payer: Self-pay | Admitting: Family

## 2011-09-24 LAB — URINE CULTURE: Colony Count: NO GROWTH

## 2011-09-24 NOTE — Telephone Encounter (Signed)
Pls let pt know that her urine culture is negative. She can stop abx if she is feeling better.

## 2011-09-25 ENCOUNTER — Ambulatory Visit
Admission: RE | Admit: 2011-09-25 | Discharge: 2011-09-25 | Disposition: A | Discharge status: Home / Self Care | Payer: BC Managed Care – PPO | Source: Ambulatory Visit | Attending: Obstetrics & Gynecology | Admitting: Obstetrics & Gynecology

## 2011-09-25 DIAGNOSIS — Z1231 Encounter for screening mammogram for malignant neoplasm of breast: Secondary | ICD-10-CM

## 2011-09-25 NOTE — Telephone Encounter (Signed)
Notified pt. She states she has 1 day left of the antibiotic and symptoms have lessened but still persist. Advised pt if symptoms do not resolve after completion of antibiotic to call and schedule an appointment for re-evaluation. Pt voices understanding.

## 2012-01-04 DIAGNOSIS — Z0000 Encounter for general adult medical examination without abnormal findings: Secondary | ICD-10-CM | POA: Insufficient documentation

## 2012-03-17 ENCOUNTER — Ambulatory Visit (INDEPENDENT_AMBULATORY_CARE_PROVIDER_SITE_OTHER): Payer: BC Managed Care – PPO | Admitting: Family

## 2012-03-17 ENCOUNTER — Encounter: Payer: Self-pay | Admitting: Family

## 2012-03-17 VITALS — BP 110/76 | HR 70 | Temp 98.0°F | Resp 16 | Ht 65.0 in | Wt 182.0 lb

## 2012-03-17 DIAGNOSIS — J309 Allergic rhinitis, unspecified: Secondary | ICD-10-CM

## 2012-03-17 DIAGNOSIS — J302 Other seasonal allergic rhinitis: Secondary | ICD-10-CM

## 2012-03-17 MED ORDER — FLUTICASONE PROPIONATE 50 MCG/ACT NA SUSP: 2.0000 | Freq: Every day | NASAL | Status: DC

## 2012-03-17 NOTE — Progress Notes (Signed)
  Subjective:    Patient ID: Jamie Meza, female    DOB: 1981/09/06, 31 y.o.   MRN: [id number]  HPI  Jamie Meza is a 31 yr old female who presents today with chief complaint of hoarseness.  Symptoms started about 4 weeks ago.  Has not been using claritin recently. Denies associated fever, cough or nasal congestion. Some post nasal drip. She does report seasonal allergies the last few year.  Throat and ears are itching.    Review of Systems See HPI    Objective:   Physical Exam  Constitutional: She appears well-developed and well-nourished. No distress.  HENT:  Head: Normocephalic and atraumatic.  Right Ear: Tympanic membrane and ear canal normal.  Left Ear: Tympanic membrane and ear canal normal.  Mouth/Throat: No posterior oropharyngeal edema or posterior oropharyngeal erythema.  Cardiovascular: Normal rate and regular rhythm.   No murmur heard. Pulmonary/Chest: Effort normal and breath sounds normal. No respiratory distress. She has no wheezes. She has no rales. She exhibits no tenderness.          Assessment & Plan:

## 2012-03-17 NOTE — Patient Instructions (Signed)
Allergic Rhinitis  Allergic rhinitis is when the mucous membranes in the nose respond to allergens. Allergens are particles in the air that cause your body to have an allergic reaction. This causes you to release allergic antibodies. Through a chain of events, these eventually cause you to release histamine into the blood stream (hence the use of antihistamines). Although meant to be protective to the body, it is this release that causes your discomfort, such as frequent sneezing, congestion and an itchy runny nose.    CAUSES    The pollen allergens may come from grasses, trees, and weeds. This is seasonal allergic rhinitis, or "hay fever." Other allergens cause year-round allergic rhinitis (perennial allergic rhinitis) such as house dust mite allergen, pet dander and mold spores.    SYMPTOMS     Nasal stuffiness (congestion).   Runny, itchy nose with sneezing and tearing of the eyes.   There is often an itching of the mouth, eyes and ears.  It cannot be cured, but it can be controlled with medications.  DIAGNOSIS    If you are unable to determine the offending allergen, skin or blood testing may find it.  TREATMENT     Avoid the allergen.   Medications and allergy shots (immunotherapy) can help.   Hay fever may often be treated with antihistamines in pill or nasal spray forms. Antihistamines block the effects of histamine. There are over-the-counter medicines that may help with nasal congestion and swelling around the eyes. Check with your caregiver before taking or giving this medicine.  If the treatment above does not work, there are many new medications your caregiver can prescribe. Stronger medications may be used if initial measures are ineffective. Desensitizing injections can be used if medications and avoidance fails. Desensitization is when a patient is given ongoing shots until the body becomes less sensitive to the allergen. Make sure you follow up with your caregiver if problems continue.  SEEK  MEDICAL CARE IF:     You develop fever (more than 100.5 F (38.1 C).   You develop a cough that does not stop easily (persistent).   You have shortness of breath.   You start wheezing.   Symptoms interfere with normal daily activities.  Document Released: 08/19/2001 Document Revised: 11/13/2011 Document Reviewed: 02/28/2009  ExitCare Patient Information 2012 [employer].

## 2012-03-18 DIAGNOSIS — J3089 Other allergic rhinitis: Secondary | ICD-10-CM | POA: Insufficient documentation

## 2012-03-18 DIAGNOSIS — J309 Allergic rhinitis, unspecified: Secondary | ICD-10-CM | POA: Insufficient documentation

## 2012-03-18 NOTE — Assessment & Plan Note (Signed)
Restart claritin. Add flonase, call if symptoms worsen, or if no improvement with these measures.

## 2012-06-21 ENCOUNTER — Ambulatory Visit: Payer: BC Managed Care – PPO | Admitting: Family

## 2012-06-21 DIAGNOSIS — Z0289 Encounter for other administrative examinations: Secondary | ICD-10-CM

## 2012-09-01 ENCOUNTER — Telehealth: Payer: Self-pay | Admitting: *Deleted

## 2012-09-01 NOTE — Telephone Encounter (Signed)
Left message for pt to return my call so I can schedule a genetic appt.  

## 2012-09-09 ENCOUNTER — Telehealth: Payer: Self-pay | Admitting: *Deleted

## 2012-09-09 NOTE — Telephone Encounter (Signed)
Confirmed 10/07/12 genetic appt w/ pt.  Valentina Shaggy at referring to make aware.  Took paperwork to Clydie Braun.

## 2012-09-27 ENCOUNTER — Other Ambulatory Visit: Payer: PRIVATE HEALTH INSURANCE | Admitting: Lab

## 2012-09-27 ENCOUNTER — Encounter: Payer: Self-pay | Admitting: Genetic Counselor

## 2012-09-27 ENCOUNTER — Ambulatory Visit (HOSPITAL_BASED_OUTPATIENT_CLINIC_OR_DEPARTMENT_OTHER): Payer: PRIVATE HEALTH INSURANCE | Admitting: Genetic Counselor

## 2012-09-27 DIAGNOSIS — IMO0002 Reserved for concepts with insufficient information to code with codable children: Secondary | ICD-10-CM

## 2012-09-27 DIAGNOSIS — Z803 Family history of malignant neoplasm of breast: Secondary | ICD-10-CM

## 2012-09-27 DIAGNOSIS — Z808 Family history of malignant neoplasm of other organs or systems: Secondary | ICD-10-CM

## 2012-09-27 NOTE — Progress Notes (Signed)
Dr.  Marcie Bal requested a consultation for genetic counseling and risk assessment for Jamie Meza, a 31 y.o. female, for discussion of her family history of breast and prostate cancer. She presents to clinic today to discuss the possibility of a genetic predisposition to cancer, and to further clarify her risks, as well as her family members' risks for cancer.   HISTORY OF PRESENT ILLNESS: Jamie Meza is a 31 y.o. female with no personal history of cancer.    Past Medical History  Diagnosis Date  . Recurrent UTI     occur yearly  . Cervical cancer     Past Surgical History  Procedure Date  . Tubal ligation 2006    History  Substance Use Topics  . Smoking status: Never Smoker   . Smokeless tobacco: Not on file  . Alcohol Use: No    REPRODUCTIVE HISTORY AND PERSONAL RISK ASSESSMENT FACTORS: Menarche was at age 73.   Premenopausal Uterus Intact: Yes Ovaries Intact: Yes G5P3A2 , first live birth at age 63  She has not previously undergone treatment for infertility.   OCP use for <1 year   She has not used HRT in the past.    FAMILY HISTORY:  We obtained a detailed, 4-generation family history.  Significant diagnoses are listed below: Family History  Problem Relation Age of Onset  . Breast cancer Mother 63  . Breast cancer Maternal Aunt 34  . Prostate cancer Maternal Uncle   . Stroke Maternal Grandmother   . Diabetes Maternal Grandmother   . Breast cancer Maternal Aunt 45    BRCA negative  . Prostate cancer Maternal Uncle   The patient has never been diagnosed with cancer, other than HPV cervical cancer.  Her mother was diagnosed with breast cancer at age 32 and died at age 73.  The patient has 10 maternal aunts and uncles.  One aunt was diagnosed with breast cancer at age 52, and another was diagnosed with breast cancer at age 13 - the latter had BRCA testing and was negative.  Another aunt had a mastectomy to reduce her risk for breast cancer, and two  uncles were diagnosed with prostate cancer.  The remaining aunts and uncles are cancer free.  The patient's maternal grandfather had a sister who died of throat cancer.  There is no other reported family history of cancer.  Patient's maternal ancestors are of [place] and [place] descent, and paternal ancestors are of [place] American and Caucasian descent. There is no reported Ashkenazi Jewish ancestry. There is no  known consanguinity.  GENETIC COUNSELING RISK ASSESSMENT, DISCUSSION, AND SUGGESTED FOLLOW UP: We reviewed the natural history and genetic etiology of sporadic, familial and hereditary cancer syndromes.   About 5-10% of breast cancer is hereditary.  Of this, about 85% is the result of a BRCA1 or BRCA2 mutation.  We reviewed the red flags of hereditary cancer syndromes and the dominant inheritance patterns.  If the BRCA testing is negative, we discussed that we could be testing for the wrong gene.  We discussed gene panels, and that several cancer genes that are associated with different cancers can be tested at the same time.  Because of the different types of cancer that are in the patient's family, we will consider the Breast/Ovarian cancer panel testing through [facility]. The patient has an increased risk for breast cancer and will be referred to the high risk breast clinic at [facility].  The patient's family history of breast and prostate  cancer is suggestive of the following possible diagnosis: hereditary cancer syndrome  We discussed that identification of a hereditary cancer syndrome may help her care providers tailor the patients medical management. If a mutation indicating a hereditary cancer syndrome is detected in this case, the [employer] recommendations would include increased cancer survillance and possible prophylactic surgery. If a mutation is detected, the patient will be referred back to the referring provider and to any additional  appropriate care providers to discuss the relevant options.   If a mutation is not found in the patient, cancer surveillance options would be discussed for the patient according to the appropriate standard National Comprehensive Cancer Network and American Cancer Society guidelines, with consideration of their personal and family history risk factors. In this case, the patient will be referred back to their care providers for discussions of management.   In order to estimate her chance of having a BRCA mutation, we used statistical models ([employer]) and laboratory data that take into account her personal medical history, family history and ancestry.  Because each model is different, there can be a lot of variability in the risks they give.  Therefore, these numbers must be considered a rough range and not a precise risk of having a BRCA mutation.  These models estimate that she has approximately a 5-10% chance of having a mutation. Based on this assessment of her family and personal history, genetic testing is recommended.  Based on the patient's personal and family history, statistical models (Tyrer Cusik)  and literature data were used to estimate her risk of developing breast cancer. These estimate her lifetime risk of developing breast cancer to be approximately 22.2%. This estimation does take into account any genetic testing results.   After considering the risks, benefits, and limitations, the patient provided informed consent for  the following  testing: Breast and Ovarian Cancer Panel through [facility].   Per the patient's request, we will contact her by telephone to discuss these results. A follow up genetic counseling visit will be scheduled if indicated.  The patient was seen for a total of 60 minutes, greater than 50% of which was spent face-to-face counseling.  This plan is being carried out per Dr. Sharol Roussel recommendations.  This note will also be sent to the referring provider  via the electronic medical record. The patient will be supplied with a summary of this genetic counseling discussion as well as educational information on the discussed hereditary cancer syndromes following the conclusion of their visit.   Patient was discussed with Dr. Drue Second.   _______________________________________________________________________ For Office Staff:  Number of people involved in session: 3 Was an Intern/ student involved with case: yes

## 2012-11-09 ENCOUNTER — Telehealth: Payer: Self-pay | Admitting: Genetic Counselor

## 2012-11-09 NOTE — Telephone Encounter (Signed)
Revealed negative Breast/ovarian cancer panel testing.  Suggested being followed through the high risk breast clinic.  Patient would like a referral.

## 2012-11-10 ENCOUNTER — Encounter: Payer: Self-pay | Admitting: Genetic Counselor

## 2012-11-26 ENCOUNTER — Encounter: Payer: Self-pay | Admitting: Genetic Counselor

## 2014-02-01 ENCOUNTER — Ambulatory Visit: Payer: Self-pay | Admitting: Nurse Practitioner

## 2015-04-26 ENCOUNTER — Other Ambulatory Visit: Payer: Self-pay

## 2015-04-26 DIAGNOSIS — Z1231 Encounter for screening mammogram for malignant neoplasm of breast: Secondary | ICD-10-CM

## 2015-05-17 ENCOUNTER — Telehealth: Payer: Self-pay | Admitting: Family

## 2015-05-17 NOTE — Telephone Encounter (Signed)
Pre Visit letter sent  °

## 2015-05-23 ENCOUNTER — Ambulatory Visit: Payer: PRIVATE HEALTH INSURANCE

## 2015-05-25 ENCOUNTER — Ambulatory Visit
Admission: RE | Admit: 2015-05-25 | Discharge: 2015-05-25 | Disposition: A | Discharge status: Home / Self Care | Payer: PRIVATE HEALTH INSURANCE | Source: Ambulatory Visit

## 2015-05-25 DIAGNOSIS — Z1231 Encounter for screening mammogram for malignant neoplasm of breast: Secondary | ICD-10-CM

## 2015-06-06 ENCOUNTER — Telehealth: Payer: Self-pay | Admitting: Behavioral Health

## 2015-06-06 ENCOUNTER — Encounter: Payer: Self-pay | Admitting: Behavioral Health

## 2015-06-06 NOTE — Telephone Encounter (Signed)
Pre-Visit Call completed with patient and chart updated.   Pre-Visit Info documented in Specialty Comments under [employer].    

## 2015-06-07 ENCOUNTER — Encounter: Payer: PRIVATE HEALTH INSURANCE | Admitting: Family

## 2015-06-07 DIAGNOSIS — Z0289 Encounter for other administrative examinations: Secondary | ICD-10-CM

## 2015-06-08 ENCOUNTER — Telehealth: Payer: Self-pay | Admitting: Family

## 2015-06-08 ENCOUNTER — Encounter: Payer: Self-pay | Admitting: Family

## 2015-06-08 NOTE — Telephone Encounter (Signed)
Pt was no show 06/07/15, cpe/re-establish care appt, pt has not rescheduled, mailing letter, charge?

## 2015-06-10 NOTE — Telephone Encounter (Signed)
Yes please

## 2016-06-04 ENCOUNTER — Other Ambulatory Visit: Payer: Self-pay | Admitting: Obstetrics and Gynecology

## 2016-06-04 DIAGNOSIS — D259 Leiomyoma of uterus, unspecified: Secondary | ICD-10-CM

## 2016-06-17 ENCOUNTER — Ambulatory Visit
Admission: RE | Admit: 2016-06-17 | Discharge: 2016-06-17 | Disposition: A | Discharge status: Home / Self Care | Payer: 59 | Source: Ambulatory Visit | Attending: Obstetrics and Gynecology | Admitting: Obstetrics and Gynecology

## 2016-06-17 ENCOUNTER — Other Ambulatory Visit: Payer: Self-pay | Admitting: Obstetrics and Gynecology

## 2016-06-17 DIAGNOSIS — D259 Leiomyoma of uterus, unspecified: Secondary | ICD-10-CM

## 2016-06-17 HISTORY — PX: IR GENERIC HISTORICAL: IMG1180011

## 2016-06-17 NOTE — Progress Notes (Signed)
Patient ID: Jamie Meza, female   DOB: 1980/12/23, 35 y.o.   MRN: [id number]       Chief Complaint: Abnormal menstrual bleeding, systematic uterine fibroids  Referring Physician(s): Cousins,Sheronette  History of Present Illness: Jamie Meza is a 35 y.o. female G4 P3. History of prior tubal ectopic. She is also status post bilateral tubal ligation. She presents with systematic uterine fibroids. She has dysfunctional uterine bleeding and/or menorrhagia. She also has bulk related symptoms with abdominal pain, pressure and cramping as well as dyspareunia. No recent or interval fibroid therapies including birth-control pills or hormone replacement therapies. No history of prior fibroid surgery or myomectomy. Most recent Pap smear 10/11/2015 is negative. Office ultrasound 10/11/2015 demonstrated 4 small fibroids largest measuring 3.2 cm.  Past Medical History  Diagnosis Date  . Recurrent UTI     occur yearly  . Cervical cancer     Past Surgical History  Procedure Laterality Date  . Tubal ligation  2006    Allergies: Review of patient's allergies indicates no known allergies.  Medications: Prior to Admission medications   Medication Sig Start Date End Date Taking? Authorizing Provider  loratadine (CLARITIN) 10 MG tablet Take 10 mg by mouth as needed.     Historical Provider, MD     Family History  Problem Relation Age of Onset  . Breast cancer Mother 84  . Breast cancer Maternal Aunt 34  . Prostate cancer Maternal Uncle   . Stroke Maternal Grandmother   . Diabetes Maternal Grandmother   . Breast cancer Maternal Aunt 45    BRCA negative  . Prostate cancer Maternal Uncle     Social History   Social History  . Marital Status: Married    Spouse Name: N/A  . Number of Children: N/A  . Years of Education: N/A   Social History Main Topics  . Smoking status: Never Smoker   . Smokeless tobacco: Not on file  . Alcohol Use: No  . Drug Use: No  . Sexual Activity: Not on  file   Other Topics Concern  . 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   4 children- oldest is 9 girl, 47 yr old step son, 61 yr old daughter, 5 year son   Married.     Review of Systems: A 12 point ROS discussed and pertinent positives are indicated in the HPI above.  All other systems are negative.  Review of Systems  Vital Signs: BP 95/79 mmHg  Pulse 76  Temp(Src) 98.2 F (36.8 C) (Oral)  Resp 14  Ht 5' 6" (1.676 m)  Wt 184 lb (83.462 kg)  BMI 29.71 kg/m2  SpO2 100%  LMP 06/01/2016 (Approximate)  Physical Exam  Constitutional: She is oriented to person, place, and time. She appears well-developed and well-nourished. No distress.  Cardiovascular: Normal rate, regular rhythm and intact distal pulses.   No murmur heard. Pulmonary/Chest: Effort normal and breath sounds normal. No respiratory distress. She has no wheezes.  Abdominal: Soft. Bowel sounds are normal. She exhibits no distension and no mass. There is no tenderness. No hernia.  No organomegaly. Fibroid uterus is not palpable on lower abdominal exam.  Musculoskeletal: She exhibits no edema or tenderness.  Neurological: She is alert and oriented to person, place, and time.  Skin: Skin is warm and dry. She is not diaphoretic. No erythema. No pallor.  Psychiatric: She has a normal mood and affect. Her behavior is normal.  Mallampati Score:    2 Imaging: No results found.  Labs:  CBC: No results for input(s): WBC, HGB, HCT, PLT in the last 8760 hours.  COAGS: No results for input(s): INR, APTT in the last 8760 hours.  BMP: No results for input(s): NA, K, CL, CO2, GLUCOSE, BUN, CALCIUM, CREATININE, GFRNONAA, GFRAA in the last 8760 hours.  Invalid input(s): CMP  LIVER FUNCTION TESTS: No results for input(s): BILITOT, AST, ALT, ALKPHOS, PROT, ALBUMIN in the last 8760 hours.  TUMOR MARKERS: No results for input(s): AFPTM, CEA, CA199, CHROMGRNA  in the last 8760 hours.  Assessment and Plan:  Systematic uterine fibroids with menorrhagia, dysmenorrhea, dyspareunia as well as abdominal pelvic pain and pressure. Treatment options were reviewed. Our discussion centered around uterine fibroid embolization. This procedure was reviewed in detail including the procedure, risks, benefits and alternatives. Expect goals and outcomes were also reviewed as well as the recovery. All questions were addressed. She would like to proceed with the workup which would include a pelvic MRI without and with contrast to assess fibroid anatomy for embolization. This will be scheduled in the next week.  Plan: Schedule for pelvic MRI without and with contrast.  Thank you for this interesting consult.  I greatly enjoyed meeting Jamie Meza and look forward to participating in their care.  A copy of this report was sent to the requesting provider on this date.  Electronically Signed: Greggory Keen 06/17/2016, 2:59 PM   I spent a total of  40 Minutes   in face to face in clinical consultation, greater than 50% of which was counseling/coordinating care for this patient with systematic uterine fibroids.

## 2016-06-26 ENCOUNTER — Ambulatory Visit
Admission: RE | Admit: 2016-06-26 | Discharge: 2016-06-26 | Disposition: A | Discharge status: Home / Self Care | Payer: 59 | Source: Ambulatory Visit | Attending: Obstetrics and Gynecology | Admitting: Obstetrics and Gynecology

## 2016-06-26 DIAGNOSIS — D259 Leiomyoma of uterus, unspecified: Secondary | ICD-10-CM

## 2016-06-26 MED ORDER — GADOBENATE DIMEGLUMINE 529 MG/ML IV SOLN
17.0000 mL | Freq: Once | INTRAVENOUS | Status: AC | PRN
  Administered 2016-06-26: 17 mL via INTRAVENOUS

## 2016-07-09 ENCOUNTER — Other Ambulatory Visit (HOSPITAL_COMMUNITY): Payer: Self-pay | Admitting: Interventional Radiology

## 2016-07-09 DIAGNOSIS — D259 Leiomyoma of uterus, unspecified: Secondary | ICD-10-CM

## 2016-07-22 ENCOUNTER — Other Ambulatory Visit: Payer: Self-pay | Admitting: Interventional Radiology

## 2016-07-22 DIAGNOSIS — D250 Submucous leiomyoma of uterus: Secondary | ICD-10-CM

## 2016-08-20 ENCOUNTER — Other Ambulatory Visit: Payer: Self-pay | Admitting: Radiology

## 2016-08-21 ENCOUNTER — Other Ambulatory Visit: Payer: Self-pay | Admitting: Radiology

## 2016-08-22 ENCOUNTER — Encounter (HOSPITAL_COMMUNITY): Payer: Self-pay

## 2016-08-22 ENCOUNTER — Ambulatory Visit (HOSPITAL_COMMUNITY)
Admission: RE | Admit: 2016-08-22 | Discharge: 2016-08-22 | Disposition: A | Discharge status: Home / Self Care | Payer: 59 | Source: Ambulatory Visit | Attending: Interventional Radiology | Admitting: Interventional Radiology

## 2016-08-22 ENCOUNTER — Observation Stay (HOSPITAL_COMMUNITY)
Admission: RE | Admit: 2016-08-22 | Discharge: 2016-08-23 | Disposition: A | Discharge status: Home / Self Care | Payer: 59 | Source: Ambulatory Visit | Attending: Interventional Radiology | Admitting: Interventional Radiology

## 2016-08-22 DIAGNOSIS — Z87440 Personal history of urinary (tract) infections: Secondary | ICD-10-CM | POA: Diagnosis not present

## 2016-08-22 DIAGNOSIS — D259 Leiomyoma of uterus, unspecified: Principal | ICD-10-CM | POA: Insufficient documentation

## 2016-08-22 DIAGNOSIS — R110 Nausea: Secondary | ICD-10-CM | POA: Diagnosis not present

## 2016-08-22 DIAGNOSIS — Z23 Encounter for immunization: Secondary | ICD-10-CM | POA: Insufficient documentation

## 2016-08-22 DIAGNOSIS — T402X5A Adverse effect of other opioids, initial encounter: Secondary | ICD-10-CM | POA: Insufficient documentation

## 2016-08-22 DIAGNOSIS — Z8541 Personal history of malignant neoplasm of cervix uteri: Secondary | ICD-10-CM | POA: Insufficient documentation

## 2016-08-22 DIAGNOSIS — D219 Benign neoplasm of connective and other soft tissue, unspecified: Secondary | ICD-10-CM | POA: Diagnosis present

## 2016-08-22 HISTORY — PX: IR GENERIC HISTORICAL: IMG1180011

## 2016-08-22 LAB — BASIC METABOLIC PANEL
Anion gap: 7 (ref 5–15)
BUN: 17 mg/dL (ref 6–20)
CHLORIDE: 107 mmol/L (ref 101–111)
CO2: 23 mmol/L (ref 22–32)
CREATININE: 0.8 mg/dL (ref 0.44–1.00)
Calcium: 9.4 mg/dL (ref 8.9–10.3)
GFR calc Af Amer: >60 mL/min (ref >60)
GLUCOSE: 81 mg/dL (ref 65–99)
POTASSIUM: 3.7 mmol/L (ref 3.5–5.1)
SODIUM: 137 mmol/L (ref 135–145)

## 2016-08-22 LAB — CBC WITH DIFFERENTIAL/PLATELET
Basophils Absolute: 0 10*3/uL (ref 0.0–0.1)
Basophils Relative: 0 %
EOS ABS: 0.2 10*3/uL (ref 0.0–0.7)
Eosinophils Relative: 2 %
HCT: 32.9 % — ABNORMAL LOW (ref 36.0–46.0)
HEMOGLOBIN: 11 g/dL — AB (ref 12.0–15.0)
LYMPHS ABS: 2.5 10*3/uL (ref 0.7–4.0)
LYMPHS PCT: 34 %
MCH: 29.3 pg (ref 26.0–34.0)
MCHC: 33.4 g/dL (ref 30.0–36.0)
MCV: 87.7 fL (ref 78.0–100.0)
MONO ABS: 0.5 10*3/uL (ref 0.1–1.0)
Monocytes Relative: 8 %
Neutro Abs: 4 10*3/uL (ref 1.7–7.7)
Neutrophils Relative %: 56 %
Platelets: 260 10*3/uL (ref 150–400)
RBC: 3.75 MIL/uL — AB (ref 3.87–5.11)
RDW: 13.5 % (ref 11.5–15.5)
WBC: 7.2 10*3/uL (ref 4.0–10.5)

## 2016-08-22 LAB — PROTIME-INR
INR: 0.97
Prothrombin Time: 12.9 seconds (ref 11.4–15.2)

## 2016-08-22 LAB — HCG, SERUM, QUALITATIVE: Preg, Serum: NEGATIVE

## 2016-08-22 MED ORDER — FLUMAZENIL 0.5 MG/5ML IV SOLN
INTRAVENOUS | Status: AC
  Filled 2016-08-22: qty 5

## 2016-08-22 MED ORDER — MORPHINE SULFATE (PF) 2 MG/ML IV SOLN
2.0000 mg | INTRAVENOUS | Status: DC | PRN
Start: 2016-08-22 — End: 2016-08-23
  Administered 2016-08-22 – 2016-08-23 (×2): 2 mg via INTRAVENOUS
  Filled 2016-08-22 (×3): qty 1

## 2016-08-22 MED ORDER — INFLUENZA VAC SPLIT QUAD 0.5 ML IM SUSY
0.5000 mL | PREFILLED_SYRINGE | INTRAMUSCULAR | Status: AC
  Administered 2016-08-23: 0.5 mL via INTRAMUSCULAR
  Filled 2016-08-22 (×2): qty 0.5

## 2016-08-22 MED ORDER — PROMETHAZINE HCL 25 MG RE SUPP: 25.0000 mg | Freq: Three times a day (TID) | RECTAL | Status: DC | PRN

## 2016-08-22 MED ORDER — MIDAZOLAM HCL 2 MG/2ML IJ SOLN
INTRAMUSCULAR | Status: AC | PRN
  Administered 2016-08-22 (×2): 1 mg via INTRAVENOUS
  Administered 2016-08-22: 0.5 mg via INTRAVENOUS
  Administered 2016-08-22: 1 mg via INTRAVENOUS
  Administered 2016-08-22: 0.5 mg via INTRAVENOUS
  Administered 2016-08-22: 1 mg via INTRAVENOUS

## 2016-08-22 MED ORDER — KETOROLAC TROMETHAMINE 30 MG/ML IJ SOLN
30.0000 mg | Freq: Four times a day (QID) | INTRAMUSCULAR | Status: DC
Start: 2016-08-22 — End: 2016-08-23
  Administered 2016-08-22 – 2016-08-23 (×4): 30 mg via INTRAVENOUS
  Filled 2016-08-22 (×4): qty 1

## 2016-08-22 MED ORDER — SODIUM CHLORIDE 0.9% FLUSH: 9.0000 mL | INTRAVENOUS | Status: DC | PRN

## 2016-08-22 MED ORDER — LIDOCAINE HCL 1 % IJ SOLN
INTRAMUSCULAR | Status: AC | PRN
  Administered 2016-08-22: 5 mL via INTRADERMAL

## 2016-08-22 MED ORDER — DIPHENHYDRAMINE HCL 50 MG/ML IJ SOLN: 12.5000 mg | Freq: Four times a day (QID) | INTRAMUSCULAR | Status: DC | PRN

## 2016-08-22 MED ORDER — LIDOCAINE HCL 1 % IJ SOLN
INTRAMUSCULAR | Status: AC
  Filled 2016-08-22: qty 20

## 2016-08-22 MED ORDER — NALOXONE HCL 0.4 MG/ML IJ SOLN
INTRAMUSCULAR | Status: AC
  Filled 2016-08-22: qty 1

## 2016-08-22 MED ORDER — PROMETHAZINE HCL 25 MG PO TABS
25.0000 mg | ORAL_TABLET | Freq: Three times a day (TID) | ORAL | Status: DC | PRN
  Administered 2016-08-22: 25 mg via ORAL
  Filled 2016-08-22: qty 1

## 2016-08-22 MED ORDER — IOPAMIDOL (ISOVUE-300) INJECTION 61%
100.0000 mL | Freq: Once | INTRAVENOUS | Status: AC | PRN
  Administered 2016-08-22: 15 mL via INTRA_ARTERIAL

## 2016-08-22 MED ORDER — FENTANYL CITRATE (PF) 100 MCG/2ML IJ SOLN
INTRAMUSCULAR | Status: AC | PRN
  Administered 2016-08-22 (×2): 50 ug via INTRAVENOUS
  Administered 2016-08-22: 25 ug via INTRAVENOUS
  Administered 2016-08-22: 50 ug via INTRAVENOUS
  Administered 2016-08-22 (×2): 25 ug via INTRAVENOUS

## 2016-08-22 MED ORDER — DEXAMETHASONE SODIUM PHOSPHATE 10 MG/ML IJ SOLN
4.0000 mg | Freq: Once | INTRAMUSCULAR | Status: AC
  Administered 2016-08-22: 4 mg via INTRAVENOUS
  Filled 2016-08-22 (×2): qty 0.4

## 2016-08-22 MED ORDER — HYDROCODONE-ACETAMINOPHEN 5-325 MG PO TABS
1.0000 | ORAL_TABLET | ORAL | Status: DC | PRN
  Administered 2016-08-22: 2 via ORAL
  Filled 2016-08-22: qty 2

## 2016-08-22 MED ORDER — FENTANYL CITRATE (PF) 100 MCG/2ML IJ SOLN
INTRAMUSCULAR | Status: AC
  Filled 2016-08-22: qty 2

## 2016-08-22 MED ORDER — MIDAZOLAM HCL 2 MG/2ML IJ SOLN
INTRAMUSCULAR | Status: AC
  Filled 2016-08-22: qty 4

## 2016-08-22 MED ORDER — MIDAZOLAM HCL 2 MG/2ML IJ SOLN
INTRAMUSCULAR | Status: AC
  Filled 2016-08-22: qty 2

## 2016-08-22 MED ORDER — IOPAMIDOL (ISOVUE-300) INJECTION 61%
100.0000 mL | Freq: Once | INTRAVENOUS | Status: AC | PRN
  Administered 2016-08-22: 65 mL via INTRA_ARTERIAL

## 2016-08-22 MED ORDER — HYDROMORPHONE HCL 2 MG/ML IJ SOLN
INTRAMUSCULAR | Status: AC
  Filled 2016-08-22: qty 1

## 2016-08-22 MED ORDER — KETOROLAC TROMETHAMINE 30 MG/ML IJ SOLN
INTRAMUSCULAR | Status: AC
Start: 2016-08-22 — End: 2016-08-22
  Administered 2016-08-22: 30 mg
  Filled 2016-08-22: qty 1

## 2016-08-22 MED ORDER — KETOROLAC TROMETHAMINE 30 MG/ML IJ SOLN
30.0000 mg | INTRAMUSCULAR | Status: AC
  Administered 2016-08-22: 30 mg via INTRAVENOUS
  Filled 2016-08-22: qty 1

## 2016-08-22 MED ORDER — SODIUM CHLORIDE 0.9 % IV SOLN
INTRAVENOUS | Status: DC
  Administered 2016-08-22: 08:00:00 via INTRAVENOUS

## 2016-08-22 MED ORDER — HYDROMORPHONE 1 MG/ML IV SOLN
INTRAVENOUS | Status: DC
  Administered 2016-08-22: 12:00:00 via INTRAVENOUS
  Administered 2016-08-22: 4.5 mg via INTRAVENOUS

## 2016-08-22 MED ORDER — DOCUSATE SODIUM 100 MG PO CAPS
100.0000 mg | ORAL_CAPSULE | Freq: Two times a day (BID) | ORAL | Status: DC
  Administered 2016-08-22 – 2016-08-23 (×2): 100 mg via ORAL
  Filled 2016-08-22 (×2): qty 1

## 2016-08-22 MED ORDER — HYDROMORPHONE 1 MG/ML IV SOLN
INTRAVENOUS | Status: AC
  Filled 2016-08-22: qty 25

## 2016-08-22 MED ORDER — ONDANSETRON HCL 4 MG/2ML IJ SOLN
4.0000 mg | Freq: Four times a day (QID) | INTRAMUSCULAR | Status: DC | PRN
  Filled 2016-08-22: qty 2

## 2016-08-22 MED ORDER — HYDROMORPHONE HCL 1 MG/ML IJ SOLN
INTRAMUSCULAR | Status: AC | PRN
  Administered 2016-08-22: 1 mg via INTRAVENOUS

## 2016-08-22 MED ORDER — SODIUM CHLORIDE 0.9 % IV SOLN: 250.0000 mL | INTRAVENOUS | Status: DC | PRN

## 2016-08-22 MED ORDER — DIPHENHYDRAMINE HCL 12.5 MG/5ML PO ELIX
12.5000 mg | ORAL_SOLUTION | Freq: Four times a day (QID) | ORAL | Status: DC | PRN
  Filled 2016-08-22: qty 5

## 2016-08-22 MED ORDER — NALOXONE HCL 0.4 MG/ML IJ SOLN: 0.4000 mg | INTRAMUSCULAR | Status: DC | PRN

## 2016-08-22 MED ORDER — ONDANSETRON HCL 4 MG/2ML IJ SOLN
4.0000 mg | Freq: Four times a day (QID) | INTRAMUSCULAR | Status: DC | PRN
  Administered 2016-08-22: 4 mg via INTRAVENOUS

## 2016-08-22 MED ORDER — CEFAZOLIN SODIUM-DEXTROSE 2-4 GM/100ML-% IV SOLN
2.0000 g | INTRAVENOUS | Status: AC
  Administered 2016-08-22: 2 g via INTRAVENOUS
  Filled 2016-08-22: qty 100

## 2016-08-22 MED ORDER — FENTANYL CITRATE (PF) 100 MCG/2ML IJ SOLN
INTRAMUSCULAR | Status: AC
  Filled 2016-08-22: qty 4

## 2016-08-22 MED ORDER — SODIUM CHLORIDE 0.9% FLUSH: 3.0000 mL | Freq: Two times a day (BID) | INTRAVENOUS | Status: DC

## 2016-08-22 MED ORDER — SODIUM CHLORIDE 0.9% FLUSH: 3.0000 mL | INTRAVENOUS | Status: DC | PRN

## 2016-08-22 MED ORDER — LORATADINE 10 MG PO TABS: 10.0000 mg | ORAL_TABLET | ORAL | Status: DC | PRN

## 2016-08-22 NOTE — Progress Notes (Signed)
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 Paged and notified on call IR Dr. Camelia Eng  Vicodin 5-325 , 1-2 tablet was ordered.

## 2016-08-22 NOTE — Progress Notes (Signed)
Referring Physician(s): Cousins,S  Supervising Physician: Daryll Brod  Patient Status:  Inpatient  Chief Complaint:  Symptomatic uterine fibroids  Subjective:  Patient slightly drowsy but arousable. Complaining of intermittent pelvic cramping; did have a small amount of vaginal spotting as well. Currently no nausea or vomiting. Has had a few ice chips.  Allergies: Review of patient's allergies indicates no known allergies.  Medications: Prior to Admission medications   Medication Sig Start Date End Date Taking? Authorizing Provider  ibuprofen (ADVIL,MOTRIN) 200 MG tablet Take 200 mg by mouth every 6 (six) hours as needed for moderate pain.   Yes Historical Provider, MD  Loratadine-Pseudoephedrine (CLARITIN-D 12 HOUR PO) Take 1 tablet by mouth daily as needed (allergies/congestion).   Yes Historical Provider, MD     Vital Signs: BP 108/64   Pulse 65   Temp 97.7 F (36.5 C) (Oral)   Resp (!) 8   LMP 08/15/2016   SpO2 100%   Physical Exam slightly drowsy but answers questions appropriately; abdomen soft, positive bowel sounds, mildly tender pelvic region. Puncture site right common femoral artery soft, clean, dry, nontender, no hematoma. Intact distal pulses.  Imaging: Ir Angiogram Pelvis Selective Or Supraselective  Result Date: 08/22/2016 INDICATION: Symptomatic uterine fibroids, abnormal menstrual bleeding EXAM: UTERINE FIBROID EMBOLIZATION Date:  9/15/20179/15/2017 11:34 am Radiologist:  M. Daryll Brod, MD Guidance:  Ultrasound and fluoroscopic MEDICATIONS: 2 g Ancef. The antibiotic was administered within 1 hour of the procedure. 1 mg Dilaudid, 30 mg Toradol ANESTHESIA/SEDATION: Fentanyl 225 mcg IV; Versed 5 mg IV Moderate Sedation Time:  60 minutes The patient was continuously monitored during the procedure by the interventional radiology nurse under my direct supervision. CONTRAST:  31mL ISOVUE-300 IOPAMIDOL (ISOVUE-300) INJECTION 61%, 19mL ISOVUE-300 IOPAMIDOL  (ISOVUE-300) INJECTION 61% FLUOROSCOPY TIME:  Fluoroscopy Time: 13 minutes 42 seconds (1,441 mGy). COMPLICATIONS: None immediate. PROCEDURE: Informed consent was obtained from the patient following explanation of the procedure, risks, benefits and alternatives. The patient understands, agrees and consents for the procedure. All questions were addressed. A time out was performed. Maximal barrier sterile technique utilized including caps, mask, sterile gowns, sterile gloves, large sterile drape, hand hygiene, and betadine prep. Under sterile conditions and local anesthesia, rightcommon femoral artery access was performed with a micropuncture needle. Ultrasound was utilized for access. Images were obtained for documentation. A guide wire was advanced followed by a 5-French sheath. A 5-French C2 catheter was utilized to select the contralateral left internal iliac artery. Selective left internal iliac angiogram was performed. The tortuous left uterine artery was identified. Selective catheterization was performed of the left uterine artery with a microcatheter and micro guide wire. A selective left uterine angiogram was performed. This demonstrated patency of the left uterine artery. Mild diffuse hypervascularity of the enlarged fibroid uterus. Access was adequate for embolization. For embolization, 1.5 vials of 500 - 780micron Embospheres were injected into the left uterine artery. Post embolization angiogram confirms complete stasis of the left uterine vascular territory. Microcatheter was removed. The C2 catheter was exchanged for a Sos Omni select catheter which was utilized to select the right internal iliac artery. Selective right internal iliac angiogram was performed. The patent right uterine artery was identified. For selective catheterization, the micro catheter and guidewire were utilized to select the right uterine artery. Selective right uterine angiogram was performed. This demonstrated patency of the  right uterine artery. Catheter position was safe for embolization. Embolization was performed to complete stasis with injection of 1.5 vials of 500-700 micron Embospheres. Post embolization  angiogram confirms complete stasis of the right uterine vascular territory. Injection of the rightcommon femoral artery sheath confirms access is adequate for Exoseal. Hemostasis was obtained with a 6-French Exoseal device. No immediate complication. Peripheral pedal pulses are normal +2. The patient tolerated the procedure well. No immediate complication. IMPRESSION: Successful bilateral uterine artery embolization ([facility]) Electronically Signed   By: Jerilynn Mages.  Shick M.D.   On: 08/22/2016 12:26   Ir Angiogram Pelvis Selective Or Supraselective  Result Date: 08/22/2016 INDICATION: Symptomatic uterine fibroids, abnormal menstrual bleeding EXAM: UTERINE FIBROID EMBOLIZATION Date:  9/15/20179/15/2017 11:34 am Radiologist:  M. Daryll Brod, MD Guidance:  Ultrasound and fluoroscopic MEDICATIONS: 2 g Ancef. The antibiotic was administered within 1 hour of the procedure. 1 mg Dilaudid, 30 mg Toradol ANESTHESIA/SEDATION: Fentanyl 225 mcg IV; Versed 5 mg IV Moderate Sedation Time:  60 minutes The patient was continuously monitored during the procedure by the interventional radiology nurse under my direct supervision. CONTRAST:  54mL ISOVUE-300 IOPAMIDOL (ISOVUE-300) INJECTION 61%, 72mL ISOVUE-300 IOPAMIDOL (ISOVUE-300) INJECTION 61% FLUOROSCOPY TIME:  Fluoroscopy Time: 13 minutes 42 seconds (1,441 mGy). COMPLICATIONS: None immediate. PROCEDURE: Informed consent was obtained from the patient following explanation of the procedure, risks, benefits and alternatives. The patient understands, agrees and consents for the procedure. All questions were addressed. A time out was performed. Maximal barrier sterile technique utilized including caps, mask, sterile gowns, sterile gloves, large sterile drape, hand hygiene, and betadine prep. Under sterile  conditions and local anesthesia, rightcommon femoral artery access was performed with a micropuncture needle. Ultrasound was utilized for access. Images were obtained for documentation. A guide wire was advanced followed by a 5-French sheath. A 5-French C2 catheter was utilized to select the contralateral left internal iliac artery. Selective left internal iliac angiogram was performed. The tortuous left uterine artery was identified. Selective catheterization was performed of the left uterine artery with a microcatheter and micro guide wire. A selective left uterine angiogram was performed. This demonstrated patency of the left uterine artery. Mild diffuse hypervascularity of the enlarged fibroid uterus. Access was adequate for embolization. For embolization, 1.5 vials of 500 - 772micron Embospheres were injected into the left uterine artery. Post embolization angiogram confirms complete stasis of the left uterine vascular territory. Microcatheter was removed. The C2 catheter was exchanged for a Sos Omni select catheter which was utilized to select the right internal iliac artery. Selective right internal iliac angiogram was performed. The patent right uterine artery was identified. For selective catheterization, the micro catheter and guidewire were utilized to select the right uterine artery. Selective right uterine angiogram was performed. This demonstrated patency of the right uterine artery. Catheter position was safe for embolization. Embolization was performed to complete stasis with injection of 1.5 vials of 500-700 micron Embospheres. Post embolization angiogram confirms complete stasis of the right uterine vascular territory. Injection of the rightcommon femoral artery sheath confirms access is adequate for Exoseal. Hemostasis was obtained with a 6-French Exoseal device. No immediate complication. Peripheral pedal pulses are normal +2. The patient tolerated the procedure well. No immediate complication.  IMPRESSION: Successful bilateral uterine artery embolization ([facility]) Electronically Signed   By: Jerilynn Mages.  Shick M.D.   On: 08/22/2016 12:26   Ir Angiogram Selective Each Additional Vessel  Result Date: 08/22/2016 INDICATION: Symptomatic uterine fibroids, abnormal menstrual bleeding EXAM: UTERINE FIBROID EMBOLIZATION Date:  9/15/20179/15/2017 11:34 am Radiologist:  M. Daryll Brod, MD Guidance:  Ultrasound and fluoroscopic MEDICATIONS: 2 g Ancef. The antibiotic was administered within 1 hour of the procedure.  1 mg Dilaudid, 30 mg Toradol ANESTHESIA/SEDATION: Fentanyl 225 mcg IV; Versed 5 mg IV Moderate Sedation Time:  60 minutes The patient was continuously monitored during the procedure by the interventional radiology nurse under my direct supervision. CONTRAST:  61mL ISOVUE-300 IOPAMIDOL (ISOVUE-300) INJECTION 61%, 2mL ISOVUE-300 IOPAMIDOL (ISOVUE-300) INJECTION 61% FLUOROSCOPY TIME:  Fluoroscopy Time: 13 minutes 42 seconds (1,441 mGy). COMPLICATIONS: None immediate. PROCEDURE: Informed consent was obtained from the patient following explanation of the procedure, risks, benefits and alternatives. The patient understands, agrees and consents for the procedure. All questions were addressed. A time out was performed. Maximal barrier sterile technique utilized including caps, mask, sterile gowns, sterile gloves, large sterile drape, hand hygiene, and betadine prep. Under sterile conditions and local anesthesia, rightcommon femoral artery access was performed with a micropuncture needle. Ultrasound was utilized for access. Images were obtained for documentation. A guide wire was advanced followed by a 5-French sheath. A 5-French C2 catheter was utilized to select the contralateral left internal iliac artery. Selective left internal iliac angiogram was performed. The tortuous left uterine artery was identified. Selective catheterization was performed of the left uterine artery with a microcatheter and micro guide wire. A  selective left uterine angiogram was performed. This demonstrated patency of the left uterine artery. Mild diffuse hypervascularity of the enlarged fibroid uterus. Access was adequate for embolization. For embolization, 1.5 vials of 500 - 722micron Embospheres were injected into the left uterine artery. Post embolization angiogram confirms complete stasis of the left uterine vascular territory. Microcatheter was removed. The C2 catheter was exchanged for a Sos Omni select catheter which was utilized to select the right internal iliac artery. Selective right internal iliac angiogram was performed. The patent right uterine artery was identified. For selective catheterization, the micro catheter and guidewire were utilized to select the right uterine artery. Selective right uterine angiogram was performed. This demonstrated patency of the right uterine artery. Catheter position was safe for embolization. Embolization was performed to complete stasis with injection of 1.5 vials of 500-700 micron Embospheres. Post embolization angiogram confirms complete stasis of the right uterine vascular territory. Injection of the rightcommon femoral artery sheath confirms access is adequate for Exoseal. Hemostasis was obtained with a 6-French Exoseal device. No immediate complication. Peripheral pedal pulses are normal +2. The patient tolerated the procedure well. No immediate complication. IMPRESSION: Successful bilateral uterine artery embolization ([facility]) Electronically Signed   By: Jerilynn Mages.  Shick M.D.   On: 08/22/2016 12:26   Ir Angiogram Selective Each Additional Vessel  Result Date: 08/22/2016 INDICATION: Symptomatic uterine fibroids, abnormal menstrual bleeding EXAM: UTERINE FIBROID EMBOLIZATION Date:  9/15/20179/15/2017 11:34 am Radiologist:  M. Daryll Brod, MD Guidance:  Ultrasound and fluoroscopic MEDICATIONS: 2 g Ancef. The antibiotic was administered within 1 hour of the procedure. 1 mg Dilaudid, 30 mg Toradol  ANESTHESIA/SEDATION: Fentanyl 225 mcg IV; Versed 5 mg IV Moderate Sedation Time:  60 minutes The patient was continuously monitored during the procedure by the interventional radiology nurse under my direct supervision. CONTRAST:  20mL ISOVUE-300 IOPAMIDOL (ISOVUE-300) INJECTION 61%, 48mL ISOVUE-300 IOPAMIDOL (ISOVUE-300) INJECTION 61% FLUOROSCOPY TIME:  Fluoroscopy Time: 13 minutes 42 seconds (1,441 mGy). COMPLICATIONS: None immediate. PROCEDURE: Informed consent was obtained from the patient following explanation of the procedure, risks, benefits and alternatives. The patient understands, agrees and consents for the procedure. All questions were addressed. A time out was performed. Maximal barrier sterile technique utilized including caps, mask, sterile gowns, sterile gloves, large sterile drape, hand hygiene, and betadine prep. Under sterile conditions and  local anesthesia, rightcommon femoral artery access was performed with a micropuncture needle. Ultrasound was utilized for access. Images were obtained for documentation. A guide wire was advanced followed by a 5-French sheath. A 5-French C2 catheter was utilized to select the contralateral left internal iliac artery. Selective left internal iliac angiogram was performed. The tortuous left uterine artery was identified. Selective catheterization was performed of the left uterine artery with a microcatheter and micro guide wire. A selective left uterine angiogram was performed. This demonstrated patency of the left uterine artery. Mild diffuse hypervascularity of the enlarged fibroid uterus. Access was adequate for embolization. For embolization, 1.5 vials of 500 - 764micron Embospheres were injected into the left uterine artery. Post embolization angiogram confirms complete stasis of the left uterine vascular territory. Microcatheter was removed. The C2 catheter was exchanged for a Sos Omni select catheter which was utilized to select the right internal iliac  artery. Selective right internal iliac angiogram was performed. The patent right uterine artery was identified. For selective catheterization, the micro catheter and guidewire were utilized to select the right uterine artery. Selective right uterine angiogram was performed. This demonstrated patency of the right uterine artery. Catheter position was safe for embolization. Embolization was performed to complete stasis with injection of 1.5 vials of 500-700 micron Embospheres. Post embolization angiogram confirms complete stasis of the right uterine vascular territory. Injection of the rightcommon femoral artery sheath confirms access is adequate for Exoseal. Hemostasis was obtained with a 6-French Exoseal device. No immediate complication. Peripheral pedal pulses are normal +2. The patient tolerated the procedure well. No immediate complication. IMPRESSION: Successful bilateral uterine artery embolization ([facility]) Electronically Signed   By: Jerilynn Mages.  Shick M.D.   On: 08/22/2016 12:26   Ir US Guide Vasc Access Right  Result Date: 08/22/2016 INDICATION: Symptomatic uterine fibroids, abnormal menstrual bleeding EXAM: UTERINE FIBROID EMBOLIZATION Date:  9/15/20179/15/2017 11:34 am Radiologist:  M. Daryll Brod, MD Guidance:  Ultrasound and fluoroscopic MEDICATIONS: 2 g Ancef. The antibiotic was administered within 1 hour of the procedure. 1 mg Dilaudid, 30 mg Toradol ANESTHESIA/SEDATION: Fentanyl 225 mcg IV; Versed 5 mg IV Moderate Sedation Time:  60 minutes The patient was continuously monitored during the procedure by the interventional radiology nurse under my direct supervision. CONTRAST:  8mL ISOVUE-300 IOPAMIDOL (ISOVUE-300) INJECTION 61%, 40mL ISOVUE-300 IOPAMIDOL (ISOVUE-300) INJECTION 61% FLUOROSCOPY TIME:  Fluoroscopy Time: 13 minutes 42 seconds (1,441 mGy). COMPLICATIONS: None immediate. PROCEDURE: Informed consent was obtained from the patient following explanation of the procedure, risks, benefits and  alternatives. The patient understands, agrees and consents for the procedure. All questions were addressed. A time out was performed. Maximal barrier sterile technique utilized including caps, mask, sterile gowns, sterile gloves, large sterile drape, hand hygiene, and betadine prep. Under sterile conditions and local anesthesia, rightcommon femoral artery access was performed with a micropuncture needle. Ultrasound was utilized for access. Images were obtained for documentation. A guide wire was advanced followed by a 5-French sheath. A 5-French C2 catheter was utilized to select the contralateral left internal iliac artery. Selective left internal iliac angiogram was performed. The tortuous left uterine artery was identified. Selective catheterization was performed of the left uterine artery with a microcatheter and micro guide wire. A selective left uterine angiogram was performed. This demonstrated patency of the left uterine artery. Mild diffuse hypervascularity of the enlarged fibroid uterus. Access was adequate for embolization. For embolization, 1.5 vials of 500 - 78micron Embospheres were injected into the left uterine artery. Post embolization angiogram confirms complete  stasis of the left uterine vascular territory. Microcatheter was removed. The C2 catheter was exchanged for a Sos Omni select catheter which was utilized to select the right internal iliac artery. Selective right internal iliac angiogram was performed. The patent right uterine artery was identified. For selective catheterization, the micro catheter and guidewire were utilized to select the right uterine artery. Selective right uterine angiogram was performed. This demonstrated patency of the right uterine artery. Catheter position was safe for embolization. Embolization was performed to complete stasis with injection of 1.5 vials of 500-700 micron Embospheres. Post embolization angiogram confirms complete stasis of the right uterine  vascular territory. Injection of the rightcommon femoral artery sheath confirms access is adequate for Exoseal. Hemostasis was obtained with a 6-French Exoseal device. No immediate complication. Peripheral pedal pulses are normal +2. The patient tolerated the procedure well. No immediate complication. IMPRESSION: Successful bilateral uterine artery embolization ([facility]) Electronically Signed   By: Jerilynn Mages.  Shick M.D.   On: 08/22/2016 12:26   Ir Embo Arterial Not [place] Guide Roadmapping  Result Date: 08/22/2016 INDICATION: Symptomatic uterine fibroids, abnormal menstrual bleeding EXAM: UTERINE FIBROID EMBOLIZATION Date:  9/15/20179/15/2017 11:34 am Radiologist:  M. Daryll Brod, MD Guidance:  Ultrasound and fluoroscopic MEDICATIONS: 2 g Ancef. The antibiotic was administered within 1 hour of the procedure. 1 mg Dilaudid, 30 mg Toradol ANESTHESIA/SEDATION: Fentanyl 225 mcg IV; Versed 5 mg IV Moderate Sedation Time:  60 minutes The patient was continuously monitored during the procedure by the interventional radiology nurse under my direct supervision. CONTRAST:  3mL ISOVUE-300 IOPAMIDOL (ISOVUE-300) INJECTION 61%, 45mL ISOVUE-300 IOPAMIDOL (ISOVUE-300) INJECTION 61% FLUOROSCOPY TIME:  Fluoroscopy Time: 13 minutes 42 seconds (1,441 mGy). COMPLICATIONS: None immediate. PROCEDURE: Informed consent was obtained from the patient following explanation of the procedure, risks, benefits and alternatives. The patient understands, agrees and consents for the procedure. All questions were addressed. A time out was performed. Maximal barrier sterile technique utilized including caps, mask, sterile gowns, sterile gloves, large sterile drape, hand hygiene, and betadine prep. Under sterile conditions and local anesthesia, rightcommon femoral artery access was performed with a micropuncture needle. Ultrasound was utilized for access. Images were obtained for documentation. A guide wire was advanced followed by a 5-French  sheath. A 5-French C2 catheter was utilized to select the contralateral left internal iliac artery. Selective left internal iliac angiogram was performed. The tortuous left uterine artery was identified. Selective catheterization was performed of the left uterine artery with a microcatheter and micro guide wire. A selective left uterine angiogram was performed. This demonstrated patency of the left uterine artery. Mild diffuse hypervascularity of the enlarged fibroid uterus. Access was adequate for embolization. For embolization, 1.5 vials of 500 - 756micron Embospheres were injected into the left uterine artery. Post embolization angiogram confirms complete stasis of the left uterine vascular territory. Microcatheter was removed. The C2 catheter was exchanged for a Sos Omni select catheter which was utilized to select the right internal iliac artery. Selective right internal iliac angiogram was performed. The patent right uterine artery was identified. For selective catheterization, the micro catheter and guidewire were utilized to select the right uterine artery. Selective right uterine angiogram was performed. This demonstrated patency of the right uterine artery. Catheter position was safe for embolization. Embolization was performed to complete stasis with injection of 1.5 vials of 500-700 micron Embospheres. Post embolization angiogram confirms complete stasis of the right uterine vascular territory. Injection of the rightcommon femoral artery sheath confirms access is adequate for Exoseal. Hemostasis  was obtained with a 6-French Exoseal device. No immediate complication. Peripheral pedal pulses are normal +2. The patient tolerated the procedure well. No immediate complication. IMPRESSION: Successful bilateral uterine artery embolization ([facility]) Electronically Signed   By: Jerilynn Mages.  Shick M.D.   On: 08/22/2016 12:26    Labs:  CBC:  Recent Labs  08/22/16 0805  WBC 7.2  HGB 11.0*  HCT 32.9*  PLT 260     COAGS:  Recent Labs  08/22/16 0805  INR 0.97    BMP:  Recent Labs  08/22/16 0805  NA 137  K 3.7  CL 107  CO2 23  GLUCOSE 81  BUN 17  CALCIUM 9.4  CREATININE 0.80  GFRNONAA >60  GFRAA >60    LIVER FUNCTION TESTS: No results for input(s): BILITOT, AST, ALT, ALKPHOS, PROT, ALBUMIN in the last 8760 hours.  Assessment and Plan: Symptomatic uterine fibroids status post successful bilateral uterine artery embolization artery today; for overnight observation for pain control; Dilaudid PCA for pain as well as IV Toradol. Add single dose IV Decadron; warm compress to pelvic region as needed; f/u in IR clinic in 3-4 weeks   Electronically Signed: D. Rowe Robert 08/22/2016, 2:49 PM   I spent a total of 15 minutes at the the patient's bedside AND on the patient's hospital floor or unit, greater than 50% of which was counseling/coordinating care for uterine fibroid embolization

## 2016-08-22 NOTE — Progress Notes (Signed)
Notified Dr. Annamaria Boots as requested by patient and patient's husband. Patient is on Dilaudid PCA which pauses due to decreased respirations and max limit received. Patient continues to report pain, husband reports being concerned. He also reports some vaginal spotting. Spoke to Dr. Annamaria Boots who said that he would notify Mr. Rayann Heman to see patient. Spoke to Mr. Allred who reports having discussed pain with patient and family prior and post procedure as possible effect. Mr. Rayann Heman is uncomfortable adding additional narcotics at this time and recommends administering Toradol as prescribed. He will also write for Decadron IV and a hot pack to be applied to lower abdomen.

## 2016-08-22 NOTE — Procedures (Signed)
Uterine fibroids  S/p UFE  No comp Stable Full report in PACS  OVERNIGHT OBS

## 2016-08-22 NOTE — H&P (Signed)
    Referring Physician(s): Cousins,S  Supervising Physician: Daryll Brod  Patient Status:  Outpatient TBA  Chief Complaint:  Symptomatic uterine fibroids  Subjective: Patient familiar to IR service from recent consultation with Dr. Annamaria Boots on 06/17/16  regarding treatment options for symptomatic uterine fibroids. She was deemed an appropriate candidate for bilateral uterine artery embolization and presents today for the procedure. She currently denies fever, headache, chest pain, dyspnea, cough, significant abdominal pain, back pain, nausea, vomiting or abnormal bleeding other than menorrhagia. Additional history as below. Past Medical History:  Diagnosis Date  . Cervical cancer ([place])   . Recurrent UTI    occur yearly   Past Surgical History:  Procedure Laterality Date  . TUBAL LIGATION  2006     Allergies: Review of patient's allergies indicates no known allergies.  Medications: Prior to Admission medications   Medication Sig Start Date End Date Taking? Authorizing Provider  ibuprofen (ADVIL,MOTRIN) 200 MG tablet Take 200 mg by mouth every 6 (six) hours as needed for moderate pain.   Yes Historical Provider, MD  loratadine (CLARITIN) 10 MG tablet Take 10 mg by mouth as needed.    Yes Historical Provider, MD     Vital Signs: BP (!) 102/57 (BP Location: Left Arm)   Pulse 75   Temp 97.7 F (36.5 C) (Oral)   Resp 18   LMP 08/15/2016   SpO2 100%   Physical Exam patient awake, alert. Chest clear to auscultation bilaterally. Heart with regular rate and rhythm. Abdomen soft, positive bowel sounds, nontender. Lower extremities with intact distal pulses, no edema  Imaging: No results found.  Labs:  CBC:  Recent Labs  08/22/16 0805  WBC 7.2  HGB 11.0*  HCT 32.9*  PLT 260    COAGS:  Recent Labs  08/22/16 0805  INR 0.97    BMP:  Recent Labs  08/22/16 0805  NA 137  K 3.7  CL 107  CO2 23  GLUCOSE 81  BUN 17  CALCIUM 9.4  CREATININE 0.80    GFRNONAA >60  GFRAA >60    LIVER FUNCTION TESTS: No results for input(s): BILITOT, AST, ALT, ALKPHOS, PROT, ALBUMIN in the last 8760 hours.  Assessment and Plan: Patient with history of symptomatic uterine fibroids. Status post consultation with Dr. Annamaria Boots on 06/17/16 for treatment options and deemed appropriate candidate for bilateral uterine artery embolization. She presents today for the procedure. Details/risks of procedure, including but not limited to, internal bleeding, infection, contrast nephropathy, nontarget embolization discussed with patient and husband with their understanding and consent. Postprocedure she will be admitted for overnight observation for pain control.    Electronically Signed: D. Rowe Robert 08/22/2016, 9:02 AM   I spent a total of 30 minutes at the the patient's bedside AND on the patient's hospital floor or unit, greater than 50% of which was counseling/coordinating care for bilateral uterine artery embolization

## 2016-08-22 NOTE — Progress Notes (Signed)
Inserted foley cath per hospital protocol prior to IR procedure.  Pt states "I get urinary tract infections all the time."  I just had one last month.

## 2016-08-23 DIAGNOSIS — D259 Leiomyoma of uterus, unspecified: Secondary | ICD-10-CM | POA: Diagnosis not present

## 2016-08-23 MED ORDER — ONDANSETRON HCL 8 MG PO TABS: 8.0000 mg | ORAL_TABLET | Freq: Three times a day (TID) | ORAL | 0 refills | Status: DC | PRN

## 2016-08-23 MED ORDER — OXYCODONE-ACETAMINOPHEN 5-325 MG PO TABS: 1.0000 | ORAL_TABLET | ORAL | 0 refills | Status: DC | PRN

## 2016-08-23 MED ORDER — OXYCODONE-ACETAMINOPHEN 5-325 MG PO TABS
1.0000 | ORAL_TABLET | ORAL | Status: DC | PRN
  Administered 2016-08-23: 2 via ORAL
  Filled 2016-08-23: qty 2

## 2016-08-23 MED ORDER — IBUPROFEN 200 MG PO TABS: 800.0000 mg | ORAL_TABLET | Freq: Three times a day (TID) | ORAL | 0 refills | Status: DC | PRN

## 2016-08-23 MED ORDER — DOCUSATE SODIUM 100 MG PO CAPS: 100.0000 mg | ORAL_CAPSULE | Freq: Two times a day (BID) | ORAL | 0 refills | Status: DC

## 2016-08-23 NOTE — Discharge Instructions (Signed)
Uterine Artery Embolization for Fibroids, Care After °Refer to this sheet in the next few weeks. These instructions provide you with information on caring for yourself after your procedure. Your health care provider may also give you more specific instructions. Your treatment has been planned according to current medical practices, but problems sometimes occur. Call your health care provider if you have any problems or questions after your procedure. °WHAT TO EXPECT AFTER THE PROCEDURE °After your procedure, it is typical to have cramping in the pelvis. You will be given pain medicine to control it. °HOME CARE INSTRUCTIONS °· Only take over-the-counter or prescription medicines for pain, discomfort, or fever as directed by your health care provider. °· Do not take aspirin. It can cause bleeding. °· Follow your health care provider's advice regarding medicines given to you, diet, activity, and when to begin sexual activity. °· See your health care provider for follow-up care as directed. °SEEK MEDICAL CARE IF: °· You have a fever. °· You have redness, swelling, and pain around your incision site. °· You have pus draining from your incision. °· You have a rash. °SEEK IMMEDIATE MEDICAL CARE IF: °· You have bleeding from your incision site. °· You have difficulty breathing. °· You have chest pain. °· You have severe abdominal pain. °· You have leg pain. °· You become dizzy and faint. °  °This information is not intended to replace advice given to you by your health care provider. Make sure you discuss any questions you have with your health care provider. °  °Document Released: 09/14/2013 Document Reviewed: 09/14/2013 °Elsevier Interactive Patient Education ©2016 [employer] ° °

## 2016-08-23 NOTE — Progress Notes (Signed)
Pt discharged 08/23/2016 at 42 am. Pt discharged to home with husband in stable condition. Prescriptions given, education done. Thomasene Lot, RN

## 2016-08-23 NOTE — Discharge Summary (Signed)
   Patient ID: Jamie Meza MRN: [id number] DOB/AGE: [birth year] 35 y.o.  Admit date: 08/22/2016 Discharge date: 08/23/2016  Supervising Physician: Jacqulynn Cadet  Admission Diagnoses: Uterine Leiomyoma  Discharge Diagnoses:  Active Problems:   Uterine leiomyoma   Fibroids  Procedures: Uterine Artery Embolization  Discharged Condition: good  Hospital Course: The patient was admitted and underwent an [place].  She tolerated the procedure well.  She some issues overnight with nausea secondary to her PCA and crampy pain.  Her PCA was discontinued and vicodin did not relieve her pain so she was given morphine.  That controlled her pain and she actually has not had any further pain since the middle of the night.  Her nausea is improved.  She is eating breakfast.  She is voiding well.  She will be transitioned to percocet prn pain.  She is otherwise stable for dc home.  Consults: None  Treatments: IV hydration and analgesia: Dilaudid, Morphine and toradol, and percocet  Discharge Exam: Blood pressure (!) 93/51, pulse 78, temperature 98.1 F (36.7 C), temperature source Oral, resp. rate 16, height 5\' 6"  (1.676 m), weight 185 lb (83.9 kg), last menstrual period 08/15/2016, SpO2 100 %. General appearance: alert, cooperative and no distress Resp: clear to auscultation bilaterally Cardio: regular rate and rhythm GI: soft, minimally tender in RLQ, otherwise nontender, ND, +BS, right inguinal site is c/d/i with no evidence of erythema, infection, or bleeding  Disposition:   Home, self-care     Medication List    TAKE these medications   CLARITIN-D 12 HOUR PO Take 1 tablet by mouth daily as needed (allergies/congestion).   docusate sodium 100 MG capsule Commonly known as:  COLACE Take 1 capsule (100 mg total) by mouth 2 (two) times daily.   ibuprofen 200 MG tablet Commonly known as:  ADVIL Take 4 tablets (800 mg total) by mouth every 8 (eight) hours as needed for mild pain,  moderate pain or cramping. What changed:  how much to take  when to take this  reasons to take this   ondansetron 8 MG tablet Commonly known as:  ZOFRAN Take 1 tablet (8 mg total) by mouth every 8 (eight) hours as needed for nausea or vomiting.   oxyCODONE-acetaminophen 5-325 MG tablet Commonly known as:  ROXICET Take 1-2 tablets by mouth every 4 (four) hours as needed for severe pain.      Follow-up Information    SHICK, MICHAEL, MD Follow up in 4 week(s).   Specialty:  Interventional Radiology Why:  our office will call you with appointment date and time Contact information: [address] [place] [id number] [phone]            Electronically Signed: Henreitta Cea 08/23/2016, 8:45 AM   I have spent Less Than 30 Minutes discharging Jamie Meza.

## 2016-09-05 ENCOUNTER — Encounter: Payer: Self-pay | Admitting: Interventional Radiology

## 2016-09-10 ENCOUNTER — Ambulatory Visit
Admission: RE | Admit: 2016-09-10 | Discharge: 2016-09-10 | Disposition: A | Discharge status: Home / Self Care | Payer: 59 | Source: Ambulatory Visit | Attending: General Surgery | Admitting: General Surgery

## 2016-09-10 DIAGNOSIS — D259 Leiomyoma of uterus, unspecified: Secondary | ICD-10-CM

## 2016-09-10 HISTORY — PX: IR GENERIC HISTORICAL: IMG1180011

## 2016-09-10 NOTE — Progress Notes (Signed)
Patient ID: Jamie Meza, female   DOB: 11/05/1981, 35 y.o.   MRN: [id number]       Chief Complaint: 3 week status post uterine fibroid embolization for systematic uterine fibroids and abnormal menstrual bleeding.  Referring Physician(s): Cousins   History of Present Illness: Jamie Meza is a 35 y.o. female G4 P3. She is now 3 weeks status post uterine fibroid embolization for systematic fibroids and dysfunctional uterine bleeding with menorrhagia. She also had bulk related symptoms with abdominal pain, pressure and cramping as well as dyspareunia. Over the last 3 weeks she has recovered at home very well. No current abdominal pelvic pain or cramping. No abnormal discharge or fevers. She is back to work. No physical limitations.  Past Medical History:  Diagnosis Date  . Cervical cancer ([place])   . Recurrent UTI    occur yearly    Past Surgical History:  Procedure Laterality Date  . IR GENERIC HISTORICAL  08/22/2016   IR ANGIOGRAM SELECTIVE EACH ADDITIONAL VESSEL 08/22/2016 Greggory Keen, MD [facility]  . IR GENERIC HISTORICAL  08/22/2016   IR ANGIOGRAM SELECTIVE EACH ADDITIONAL VESSEL 08/22/2016 Greggory Keen, MD [facility]  . IR GENERIC HISTORICAL  08/22/2016   IR ANGIOGRAM PELVIS SELECTIVE OR SUPRASELECTIVE 08/22/2016 Greggory Keen, MD [facility]  . IR GENERIC HISTORICAL  08/22/2016   IR US GUIDE VASC ACCESS RIGHT 08/22/2016 Greggory Keen, MD [facility]  . IR GENERIC HISTORICAL  08/22/2016   IR ANGIOGRAM PELVIS SELECTIVE OR SUPRASELECTIVE 08/22/2016 Greggory Keen, MD [facility]  . IR GENERIC HISTORICAL  08/22/2016   IR EMBO ARTERIAL NOT HEMORR HEMANG INC GUIDE ROADMAPPING 08/22/2016 Greggory Keen, MD [facility]  . IR GENERIC HISTORICAL  06/17/2016   IR RADIOLOGIST EVAL & MGMT 06/17/2016 Greggory Keen, MD [facility]  . TUBAL LIGATION  2006    Allergies: Review of patient's allergies indicates no known allergies.  Medications: Prior to Admission medications     Medication Sig Start Date End Date Taking? Authorizing Provider  ibuprofen (ADVIL) 200 MG tablet Take 4 tablets (800 mg total) by mouth every 8 (eight) hours as needed for mild pain, moderate pain or cramping. 08/23/16  Yes Saverio Danker, PA-C  Loratadine-Pseudoephedrine (CLARITIN-D 12 HOUR PO) Take 1 tablet by mouth daily as needed (allergies/congestion).   Yes Historical Provider, MD  docusate sodium (COLACE) 100 MG capsule Take 1 capsule (100 mg total) by mouth 2 (two) times daily. Patient not taking: Reported on 09/10/2016 08/23/16   Saverio Danker, PA-C  ondansetron (ZOFRAN) 8 MG tablet Take 1 tablet (8 mg total) by mouth every 8 (eight) hours as needed for nausea or vomiting. Patient not taking: Reported on 09/10/2016 08/23/16   Saverio Danker, PA-C  oxyCODONE-acetaminophen (ROXICET) 5-325 MG tablet Take 1-2 tablets by mouth every 4 (four) hours as needed for severe pain. Patient not taking: Reported on 09/10/2016 08/23/16   Saverio Danker, PA-C     Family History  Problem Relation Age of Onset  . Breast cancer Mother 56  . Breast cancer Maternal Aunt 34  . Prostate cancer Maternal Uncle   . Stroke Maternal Grandmother   . Diabetes Maternal Grandmother   . Breast cancer Maternal Aunt 45    BRCA negative  . Prostate cancer Maternal Uncle     Social History   Social History  . Marital status: Married    Spouse name: N/A  . Number of children: N/A  . Years of education: N/A   Social History Main Topics  .  Smoking status: Never Smoker  . Smokeless tobacco: Never Used  . Alcohol use No  . Drug use: No  . Sexual activity: Not on file   Other Topics Concern  . 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   4 children- oldest is 32 girl, 31 yr old step son, 3 yr old daughter, 5 year son   Married.     Review of Systems: A 12 point ROS discussed and pertinent positives are indicated in the HPI above.  All other systems are  negative.  Review of Systems  Vital Signs: BP 107/69 (BP Location: Left Arm, Patient Position: Sitting, Cuff Size: Normal)   Pulse 79   Temp 98.1 F (36.7 C) (Oral)   Resp 14   LMP 08/15/2016   SpO2 99%   Physical Exam  Constitutional: She appears well-developed and well-nourished. No distress.  Cardiovascular: Intact distal pulses.   Abdominal: Soft. Bowel sounds are normal. She exhibits no distension and no mass. There is no tenderness. There is no guarding.  Skin: She is not diaphoretic.    Imaging: Ir Angiogram Pelvis Selective Or Supraselective  Result Date: 08/22/2016 INDICATION: Symptomatic uterine fibroids, abnormal menstrual bleeding EXAM: UTERINE FIBROID EMBOLIZATION Date:  9/15/20179/15/2017 11:34 am Radiologist:  M. Daryll Brod, MD Guidance:  Ultrasound and fluoroscopic MEDICATIONS: 2 g Ancef. The antibiotic was administered within 1 hour of the procedure. 1 mg Dilaudid, 30 mg Toradol ANESTHESIA/SEDATION: Fentanyl 225 mcg IV; Versed 5 mg IV Moderate Sedation Time:  60 minutes The patient was continuously monitored during the procedure by the interventional radiology nurse under my direct supervision. CONTRAST:  7m ISOVUE-300 IOPAMIDOL (ISOVUE-300) INJECTION 61%, 134mISOVUE-300 IOPAMIDOL (ISOVUE-300) INJECTION 61% FLUOROSCOPY TIME:  Fluoroscopy Time: 13 minutes 42 seconds (1,441 mGy). COMPLICATIONS: None immediate. PROCEDURE: Informed consent was obtained from the patient following explanation of the procedure, risks, benefits and alternatives. The patient understands, agrees and consents for the procedure. All questions were addressed. A time out was performed. Maximal barrier sterile technique utilized including caps, mask, sterile gowns, sterile gloves, large sterile drape, hand hygiene, and betadine prep. Under sterile conditions and local anesthesia, rightcommon femoral artery access was performed with a micropuncture needle. Ultrasound was utilized for access. Images were  obtained for documentation. A guide wire was advanced followed by a 5-French sheath. A 5-French C2 catheter was utilized to select the contralateral left internal iliac artery. Selective left internal iliac angiogram was performed. The tortuous left uterine artery was identified. Selective catheterization was performed of the left uterine artery with a microcatheter and micro guide wire. A selective left uterine angiogram was performed. This demonstrated patency of the left uterine artery. Mild diffuse hypervascularity of the enlarged fibroid uterus. Access was adequate for embolization. For embolization, 1.5 vials of 500 - 70057mon Embospheres were injected into the left uterine artery. Post embolization angiogram confirms complete stasis of the left uterine vascular territory. Microcatheter was removed. The C2 catheter was exchanged for a Sos Omni select catheter which was utilized to select the right internal iliac artery. Selective right internal iliac angiogram was performed. The patent right uterine artery was identified. For selective catheterization, the micro catheter and guidewire were utilized to select the right uterine artery. Selective right uterine angiogram was performed. This demonstrated patency of the right uterine artery. Catheter position was safe for embolization. Embolization was performed to complete stasis with injection of 1.5 vials of 500-700 micron Embospheres. Post embolization angiogram confirms complete stasis  of the right uterine vascular territory. Injection of the rightcommon femoral artery sheath confirms access is adequate for Exoseal. Hemostasis was obtained with a 6-French Exoseal device. No immediate complication. Peripheral pedal pulses are normal +2. The patient tolerated the procedure well. No immediate complication. IMPRESSION: Successful bilateral uterine artery embolization (U F E) Electronically Signed   By: Jerilynn Mages.  Geena Weinhold M.D.   On: 08/22/2016 12:26   Ir Angiogram Pelvis  Selective Or Supraselective  Result Date: 08/22/2016 INDICATION: Symptomatic uterine fibroids, abnormal menstrual bleeding EXAM: UTERINE FIBROID EMBOLIZATION Date:  9/15/20179/15/2017 11:34 am Radiologist:  M. Daryll Brod, MD Guidance:  Ultrasound and fluoroscopic MEDICATIONS: 2 g Ancef. The antibiotic was administered within 1 hour of the procedure. 1 mg Dilaudid, 30 mg Toradol ANESTHESIA/SEDATION: Fentanyl 225 mcg IV; Versed 5 mg IV Moderate Sedation Time:  60 minutes The patient was continuously monitored during the procedure by the interventional radiology nurse under my direct supervision. CONTRAST:  83m ISOVUE-300 IOPAMIDOL (ISOVUE-300) INJECTION 61%, 158mISOVUE-300 IOPAMIDOL (ISOVUE-300) INJECTION 61% FLUOROSCOPY TIME:  Fluoroscopy Time: 13 minutes 42 seconds (1,441 mGy). COMPLICATIONS: None immediate. PROCEDURE: Informed consent was obtained from the patient following explanation of the procedure, risks, benefits and alternatives. The patient understands, agrees and consents for the procedure. All questions were addressed. A time out was performed. Maximal barrier sterile technique utilized including caps, mask, sterile gowns, sterile gloves, large sterile drape, hand hygiene, and betadine prep. Under sterile conditions and local anesthesia, rightcommon femoral artery access was performed with a micropuncture needle. Ultrasound was utilized for access. Images were obtained for documentation. A guide wire was advanced followed by a 5-French sheath. A 5-French C2 catheter was utilized to select the contralateral left internal iliac artery. Selective left internal iliac angiogram was performed. The tortuous left uterine artery was identified. Selective catheterization was performed of the left uterine artery with a microcatheter and micro guide wire. A selective left uterine angiogram was performed. This demonstrated patency of the left uterine artery. Mild diffuse hypervascularity of the enlarged fibroid  uterus. Access was adequate for embolization. For embolization, 1.5 vials of 500 - 70072mon Embospheres were injected into the left uterine artery. Post embolization angiogram confirms complete stasis of the left uterine vascular territory. Microcatheter was removed. The C2 catheter was exchanged for a Sos Omni select catheter which was utilized to select the right internal iliac artery. Selective right internal iliac angiogram was performed. The patent right uterine artery was identified. For selective catheterization, the micro catheter and guidewire were utilized to select the right uterine artery. Selective right uterine angiogram was performed. This demonstrated patency of the right uterine artery. Catheter position was safe for embolization. Embolization was performed to complete stasis with injection of 1.5 vials of 500-700 micron Embospheres. Post embolization angiogram confirms complete stasis of the right uterine vascular territory. Injection of the rightcommon femoral artery sheath confirms access is adequate for Exoseal. Hemostasis was obtained with a 6-French Exoseal device. No immediate complication. Peripheral pedal pulses are normal +2. The patient tolerated the procedure well. No immediate complication. IMPRESSION: Successful bilateral uterine artery embolization (U F E) Electronically Signed   By: M. Jerilynn MagesShick M.D.   On: 08/22/2016 12:26   Ir Angiogram Selective Each Additional Vessel  Result Date: 08/22/2016 INDICATION: Symptomatic uterine fibroids, abnormal menstrual bleeding EXAM: UTERINE FIBROID EMBOLIZATION Date:  9/15/20179/15/2017 11:34 am Radiologist:  M. TreDaryll BrodD Guidance:  Ultrasound and fluoroscopic MEDICATIONS: 2 g Ancef. The antibiotic was administered within 1 hour of the procedure. 1 mg Dilaudid, 30  mg Toradol ANESTHESIA/SEDATION: Fentanyl 225 mcg IV; Versed 5 mg IV Moderate Sedation Time:  60 minutes The patient was continuously monitored during the procedure by the  interventional radiology nurse under my direct supervision. CONTRAST:  59m ISOVUE-300 IOPAMIDOL (ISOVUE-300) INJECTION 61%, 114mISOVUE-300 IOPAMIDOL (ISOVUE-300) INJECTION 61% FLUOROSCOPY TIME:  Fluoroscopy Time: 13 minutes 42 seconds (1,441 mGy). COMPLICATIONS: None immediate. PROCEDURE: Informed consent was obtained from the patient following explanation of the procedure, risks, benefits and alternatives. The patient understands, agrees and consents for the procedure. All questions were addressed. A time out was performed. Maximal barrier sterile technique utilized including caps, mask, sterile gowns, sterile gloves, large sterile drape, hand hygiene, and betadine prep. Under sterile conditions and local anesthesia, rightcommon femoral artery access was performed with a micropuncture needle. Ultrasound was utilized for access. Images were obtained for documentation. A guide wire was advanced followed by a 5-French sheath. A 5-French C2 catheter was utilized to select the contralateral left internal iliac artery. Selective left internal iliac angiogram was performed. The tortuous left uterine artery was identified. Selective catheterization was performed of the left uterine artery with a microcatheter and micro guide wire. A selective left uterine angiogram was performed. This demonstrated patency of the left uterine artery. Mild diffuse hypervascularity of the enlarged fibroid uterus. Access was adequate for embolization. For embolization, 1.5 vials of 500 - 70031mon Embospheres were injected into the left uterine artery. Post embolization angiogram confirms complete stasis of the left uterine vascular territory. Microcatheter was removed. The C2 catheter was exchanged for a Sos Omni select catheter which was utilized to select the right internal iliac artery. Selective right internal iliac angiogram was performed. The patent right uterine artery was identified. For selective catheterization, the micro catheter  and guidewire were utilized to select the right uterine artery. Selective right uterine angiogram was performed. This demonstrated patency of the right uterine artery. Catheter position was safe for embolization. Embolization was performed to complete stasis with injection of 1.5 vials of 500-700 micron Embospheres. Post embolization angiogram confirms complete stasis of the right uterine vascular territory. Injection of the rightcommon femoral artery sheath confirms access is adequate for Exoseal. Hemostasis was obtained with a 6-French Exoseal device. No immediate complication. Peripheral pedal pulses are normal +2. The patient tolerated the procedure well. No immediate complication. IMPRESSION: Successful bilateral uterine artery embolization (U F E) Electronically Signed   By: M. Jerilynn MagesShick M.D.   On: 08/22/2016 12:26   Ir Angiogram Selective Each Additional Vessel  Result Date: 08/22/2016 INDICATION: Symptomatic uterine fibroids, abnormal menstrual bleeding EXAM: UTERINE FIBROID EMBOLIZATION Date:  9/15/20179/15/2017 11:34 am Radiologist:  M. TreDaryll BrodD Guidance:  Ultrasound and fluoroscopic MEDICATIONS: 2 g Ancef. The antibiotic was administered within 1 hour of the procedure. 1 mg Dilaudid, 30 mg Toradol ANESTHESIA/SEDATION: Fentanyl 225 mcg IV; Versed 5 mg IV Moderate Sedation Time:  60 minutes The patient was continuously monitored during the procedure by the interventional radiology nurse under my direct supervision. CONTRAST:  55m48mOVUE-300 IOPAMIDOL (ISOVUE-300) INJECTION 61%, 15mL20mVUE-300 IOPAMIDOL (ISOVUE-300) INJECTION 61% FLUOROSCOPY TIME:  Fluoroscopy Time: 13 minutes 42 seconds (1,441 mGy). COMPLICATIONS: None immediate. PROCEDURE: Informed consent was obtained from the patient following explanation of the procedure, risks, benefits and alternatives. The patient understands, agrees and consents for the procedure. All questions were addressed. A time out was performed. Maximal barrier  sterile technique utilized including caps, mask, sterile gowns, sterile gloves, large sterile drape, hand hygiene, and betadine prep. Under sterile conditions and local anesthesia, rightcommon  femoral artery access was performed with a micropuncture needle. Ultrasound was utilized for access. Images were obtained for documentation. A guide wire was advanced followed by a 5-French sheath. A 5-French C2 catheter was utilized to select the contralateral left internal iliac artery. Selective left internal iliac angiogram was performed. The tortuous left uterine artery was identified. Selective catheterization was performed of the left uterine artery with a microcatheter and micro guide wire. A selective left uterine angiogram was performed. This demonstrated patency of the left uterine artery. Mild diffuse hypervascularity of the enlarged fibroid uterus. Access was adequate for embolization. For embolization, 1.5 vials of 500 - 736mcron Embospheres were injected into the left uterine artery. Post embolization angiogram confirms complete stasis of the left uterine vascular territory. Microcatheter was removed. The C2 catheter was exchanged for a Sos Omni select catheter which was utilized to select the right internal iliac artery. Selective right internal iliac angiogram was performed. The patent right uterine artery was identified. For selective catheterization, the micro catheter and guidewire were utilized to select the right uterine artery. Selective right uterine angiogram was performed. This demonstrated patency of the right uterine artery. Catheter position was safe for embolization. Embolization was performed to complete stasis with injection of 1.5 vials of 500-700 micron Embospheres. Post embolization angiogram confirms complete stasis of the right uterine vascular territory. Injection of the rightcommon femoral artery sheath confirms access is adequate for Exoseal. Hemostasis was obtained with a 6-French  Exoseal device. No immediate complication. Peripheral pedal pulses are normal +2. The patient tolerated the procedure well. No immediate complication. IMPRESSION: Successful bilateral uterine artery embolization (U F E) Electronically Signed   By: MJerilynn Mages  Vansh Reckart M.D.   On: 08/22/2016 12:26   Ir [place] Vasc Access Right  Result Date: 08/22/2016 INDICATION: Symptomatic uterine fibroids, abnormal menstrual bleeding EXAM: UTERINE FIBROID EMBOLIZATION Date:  9/15/20179/15/2017 11:34 am Radiologist:  M. TDaryll Brod MD Guidance:  Ultrasound and fluoroscopic MEDICATIONS: 2 g Ancef. The antibiotic was administered within 1 hour of the procedure. 1 mg Dilaudid, 30 mg Toradol ANESTHESIA/SEDATION: Fentanyl 225 mcg IV; Versed 5 mg IV Moderate Sedation Time:  60 minutes The patient was continuously monitored during the procedure by the interventional radiology nurse under my direct supervision. CONTRAST:  643mISOVUE-300 IOPAMIDOL (ISOVUE-300) INJECTION 61%, 1589mSOVUE-300 IOPAMIDOL (ISOVUE-300) INJECTION 61% FLUOROSCOPY TIME:  Fluoroscopy Time: 13 minutes 42 seconds (1,441 mGy). COMPLICATIONS: None immediate. PROCEDURE: Informed consent was obtained from the patient following explanation of the procedure, risks, benefits and alternatives. The patient understands, agrees and consents for the procedure. All questions were addressed. A time out was performed. Maximal barrier sterile technique utilized including caps, mask, sterile gowns, sterile gloves, large sterile drape, hand hygiene, and betadine prep. Under sterile conditions and local anesthesia, rightcommon femoral artery access was performed with a micropuncture needle. Ultrasound was utilized for access. Images were obtained for documentation. A guide wire was advanced followed by a 5-French sheath. A 5-French C2 catheter was utilized to select the contralateral left internal iliac artery. Selective left internal iliac angiogram was performed. The tortuous left uterine  artery was identified. Selective catheterization was performed of the left uterine artery with a microcatheter and micro guide wire. A selective left uterine angiogram was performed. This demonstrated patency of the left uterine artery. Mild diffuse hypervascularity of the enlarged fibroid uterus. Access was adequate for embolization. For embolization, 1.5 vials of 500 - 700m75mn Embospheres were injected into the left uterine artery. Post embolization angiogram confirms complete stasis of the  left uterine vascular territory. Microcatheter was removed. The C2 catheter was exchanged for a Sos Omni select catheter which was utilized to select the right internal iliac artery. Selective right internal iliac angiogram was performed. The patent right uterine artery was identified. For selective catheterization, the micro catheter and guidewire were utilized to select the right uterine artery. Selective right uterine angiogram was performed. This demonstrated patency of the right uterine artery. Catheter position was safe for embolization. Embolization was performed to complete stasis with injection of 1.5 vials of 500-700 micron Embospheres. Post embolization angiogram confirms complete stasis of the right uterine vascular territory. Injection of the rightcommon femoral artery sheath confirms access is adequate for Exoseal. Hemostasis was obtained with a 6-French Exoseal device. No immediate complication. Peripheral pedal pulses are normal +2. The patient tolerated the procedure well. No immediate complication. IMPRESSION: Successful bilateral uterine artery embolization (U F E) Electronically Signed   By: Jerilynn Mages.  Camala Talwar M.D.   On: 08/22/2016 12:26   Ir Embo Arterial Not [place] Guide Roadmapping  Result Date: 08/22/2016 INDICATION: Symptomatic uterine fibroids, abnormal menstrual bleeding EXAM: UTERINE FIBROID EMBOLIZATION Date:  9/15/20179/15/2017 11:34 am Radiologist:  M. Daryll Brod, MD Guidance:   Ultrasound and fluoroscopic MEDICATIONS: 2 g Ancef. The antibiotic was administered within 1 hour of the procedure. 1 mg Dilaudid, 30 mg Toradol ANESTHESIA/SEDATION: Fentanyl 225 mcg IV; Versed 5 mg IV Moderate Sedation Time:  60 minutes The patient was continuously monitored during the procedure by the interventional radiology nurse under my direct supervision. CONTRAST:  72m ISOVUE-300 IOPAMIDOL (ISOVUE-300) INJECTION 61%, 167mISOVUE-300 IOPAMIDOL (ISOVUE-300) INJECTION 61% FLUOROSCOPY TIME:  Fluoroscopy Time: 13 minutes 42 seconds (1,441 mGy). COMPLICATIONS: None immediate. PROCEDURE: Informed consent was obtained from the patient following explanation of the procedure, risks, benefits and alternatives. The patient understands, agrees and consents for the procedure. All questions were addressed. A time out was performed. Maximal barrier sterile technique utilized including caps, mask, sterile gowns, sterile gloves, large sterile drape, hand hygiene, and betadine prep. Under sterile conditions and local anesthesia, rightcommon femoral artery access was performed with a micropuncture needle. Ultrasound was utilized for access. Images were obtained for documentation. A guide wire was advanced followed by a 5-French sheath. A 5-French C2 catheter was utilized to select the contralateral left internal iliac artery. Selective left internal iliac angiogram was performed. The tortuous left uterine artery was identified. Selective catheterization was performed of the left uterine artery with a microcatheter and micro guide wire. A selective left uterine angiogram was performed. This demonstrated patency of the left uterine artery. Mild diffuse hypervascularity of the enlarged fibroid uterus. Access was adequate for embolization. For embolization, 1.5 vials of 500 - 70075mon Embospheres were injected into the left uterine artery. Post embolization angiogram confirms complete stasis of the left uterine vascular  territory. Microcatheter was removed. The C2 catheter was exchanged for a Sos Omni select catheter which was utilized to select the right internal iliac artery. Selective right internal iliac angiogram was performed. The patent right uterine artery was identified. For selective catheterization, the micro catheter and guidewire were utilized to select the right uterine artery. Selective right uterine angiogram was performed. This demonstrated patency of the right uterine artery. Catheter position was safe for embolization. Embolization was performed to complete stasis with injection of 1.5 vials of 500-700 micron Embospheres. Post embolization angiogram confirms complete stasis of the right uterine vascular territory. Injection of the rightcommon femoral artery sheath confirms access is adequate for Exoseal. Hemostasis was obtained with  a 6-French Exoseal device. No immediate complication. Peripheral pedal pulses are normal +2. The patient tolerated the procedure well. No immediate complication. IMPRESSION: Successful bilateral uterine artery embolization (U F E) Electronically Signed   By: Jerilynn Mages.  Janeene Sand M.D.   On: 08/22/2016 12:26    Labs:  CBC:  Recent Labs  08/22/16 0805  WBC 7.2  HGB 11.0*  HCT 32.9*  PLT 260    COAGS:  Recent Labs  08/22/16 0805  INR 0.97    BMP:  Recent Labs  08/22/16 0805  NA 137  K 3.7  CL 107  CO2 23  GLUCOSE 81  BUN 17  CALCIUM 9.4  CREATININE 0.80  GFRNONAA >60  GFRAA >60     Assessment and Plan:  3 week status post uterine fibroid embolization. She is recovering very well as an outpatient. No physical limitations and she is back to work. No signs of delayed complications.  Plan: Continue outpatient follow-up by telephone at 3 months and repeat MRI and office visit at 6 months.    Electronically Signed: Greggory Keen 09/10/2016, 4:15 PM   I spent a total of    15 Minutes in face to face in clinical consultation, greater than 50% of which  was counseling/coordinating care for this patient with systematic uterine fibroids status post embolization.

## 2016-10-28 ENCOUNTER — Encounter: Payer: Self-pay | Admitting: General Surgery

## 2016-12-16 ENCOUNTER — Telehealth: Payer: Self-pay | Admitting: Radiology

## 2016-12-16 NOTE — Telephone Encounter (Signed)
3 mo follow up [place] call for status update.  Patient doing well.  Symptoms improving with light menstrual flow.  Prefers to follow up at 6 mo s/p [place].    Will contact patient to schedule follow up at 6 mo.    Jakye Mullens Riki Rusk, RN 12/16/2016 4:24 PM

## 2017-02-18 ENCOUNTER — Other Ambulatory Visit: Payer: Self-pay | Admitting: Obstetrics and Gynecology

## 2017-02-18 ENCOUNTER — Other Ambulatory Visit (HOSPITAL_COMMUNITY): Payer: Self-pay | Admitting: Interventional Radiology

## 2017-02-18 DIAGNOSIS — D259 Leiomyoma of uterus, unspecified: Secondary | ICD-10-CM

## 2017-03-31 ENCOUNTER — Other Ambulatory Visit: Payer: 59

## 2017-04-16 ENCOUNTER — Inpatient Hospital Stay: Admission: RE | Admit: 2017-04-16 | Payer: 59 | Source: Ambulatory Visit

## 2017-05-08 LAB — HM PAP SMEAR
HM PAP: NEGATIVE
HM Pap smear: NEGATIVE

## 2017-07-16 ENCOUNTER — Other Ambulatory Visit: Payer: Self-pay | Admitting: Obstetrics and Gynecology

## 2017-07-16 DIAGNOSIS — Z1231 Encounter for screening mammogram for malignant neoplasm of breast: Secondary | ICD-10-CM

## 2017-07-24 ENCOUNTER — Ambulatory Visit
Admission: RE | Admit: 2017-07-24 | Discharge: 2017-07-24 | Disposition: A | Discharge status: Home / Self Care | Payer: 59 | Source: Ambulatory Visit | Attending: Obstetrics and Gynecology | Admitting: Obstetrics and Gynecology

## 2017-07-24 DIAGNOSIS — Z1231 Encounter for screening mammogram for malignant neoplasm of breast: Secondary | ICD-10-CM

## 2018-06-04 IMAGING — MG 2D DIGITAL SCREENING BILATERAL MAMMOGRAM WITH CAD AND ADJUNCT TO
8 of 12 series · 8 of 28 positions shown · non-contrast
Comparison: Previous exam(s).

CLINICAL DATA: Screening.

EXAM:
2D DIGITAL SCREENING BILATERAL MAMMOGRAM WITH CAD AND ADJUNCT TOMO

[L MLO]
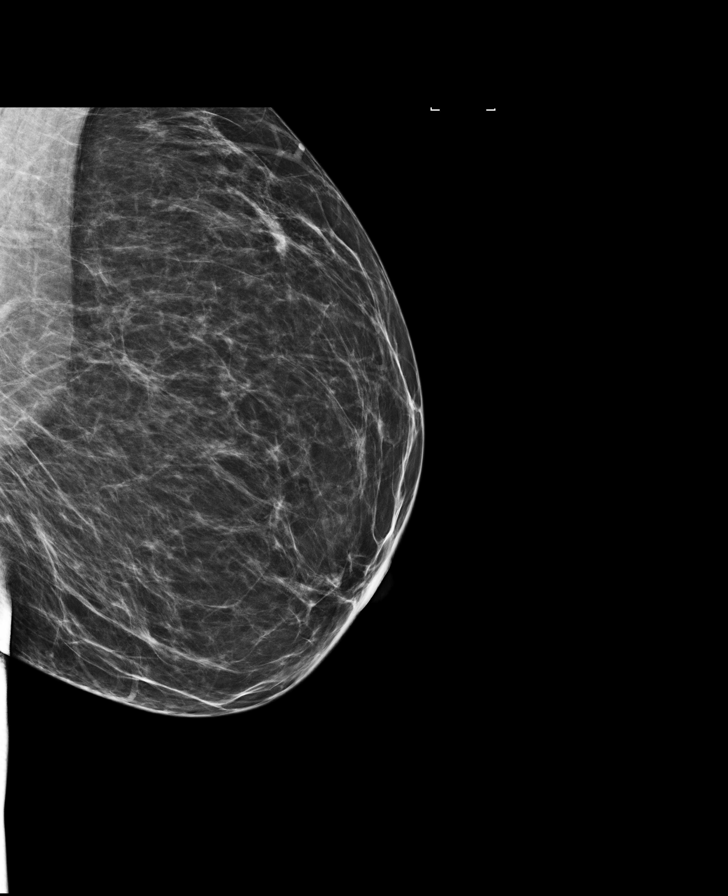

[L MLO synth-2D]
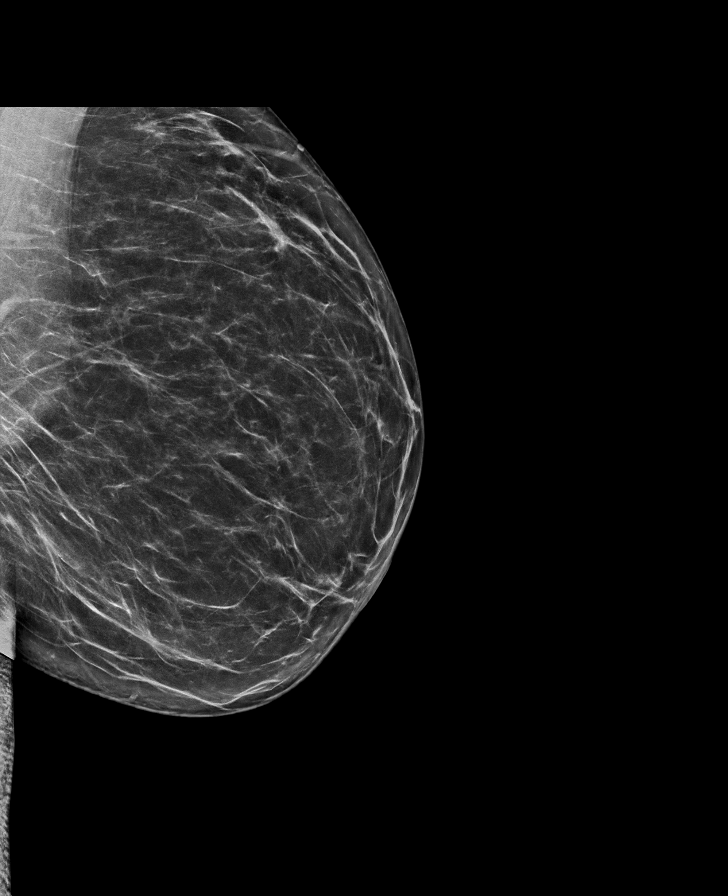

[L CC synth-2D]
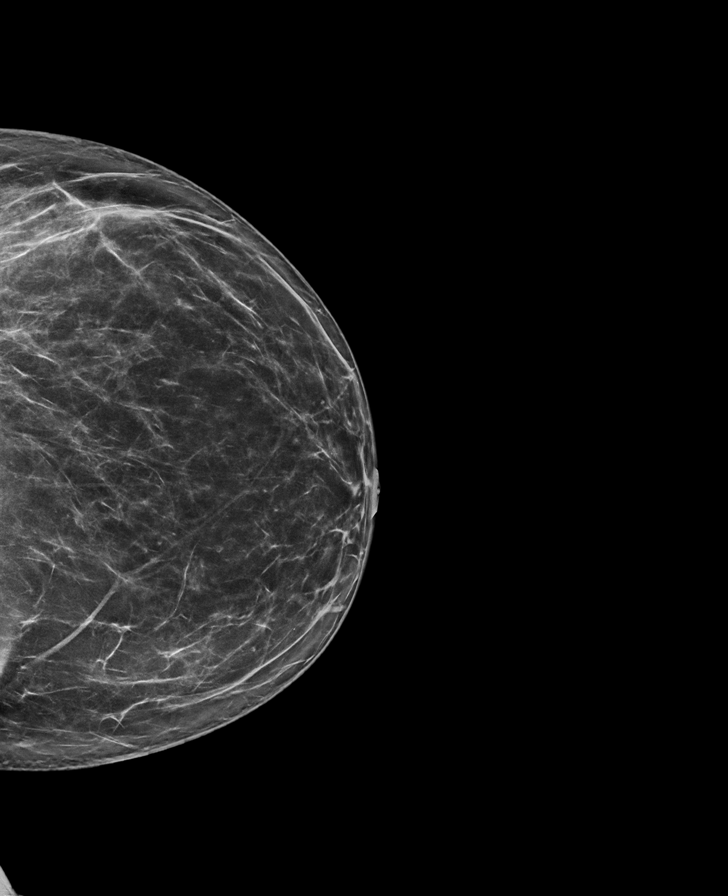

[R CC]
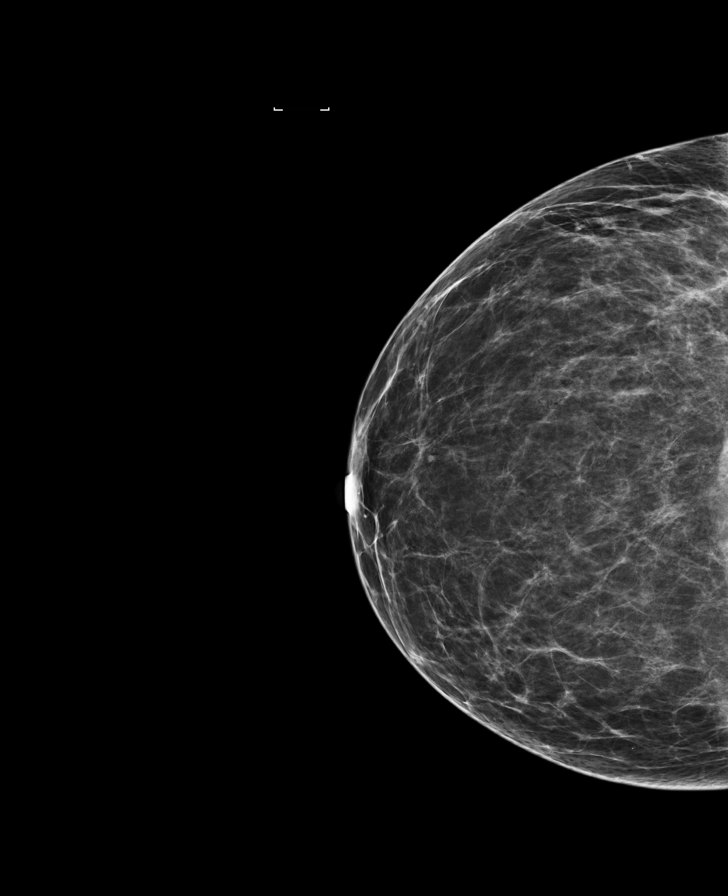

[R MLO synth-2D]
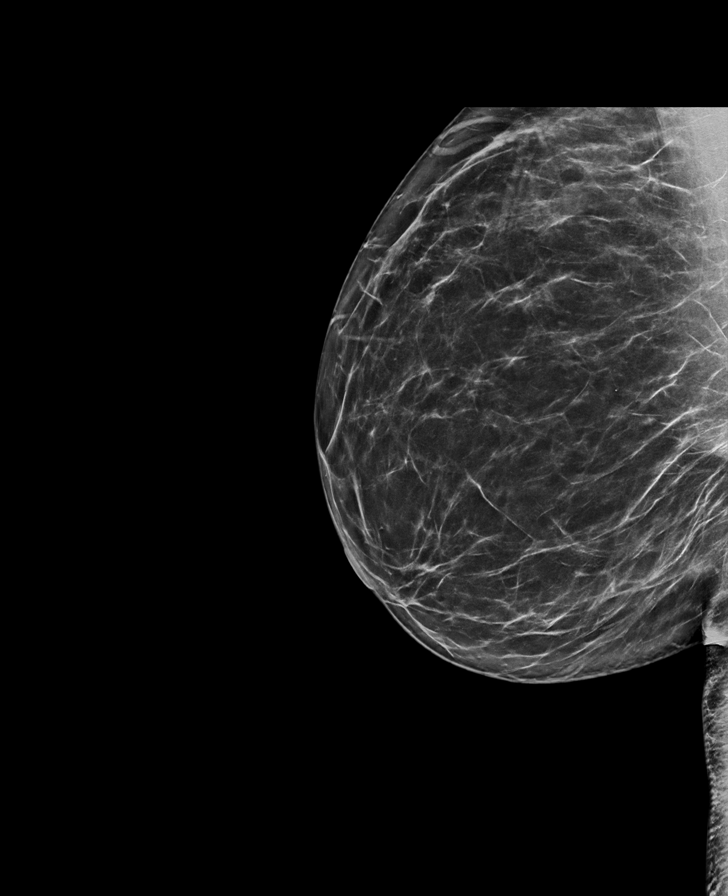

[L CC]
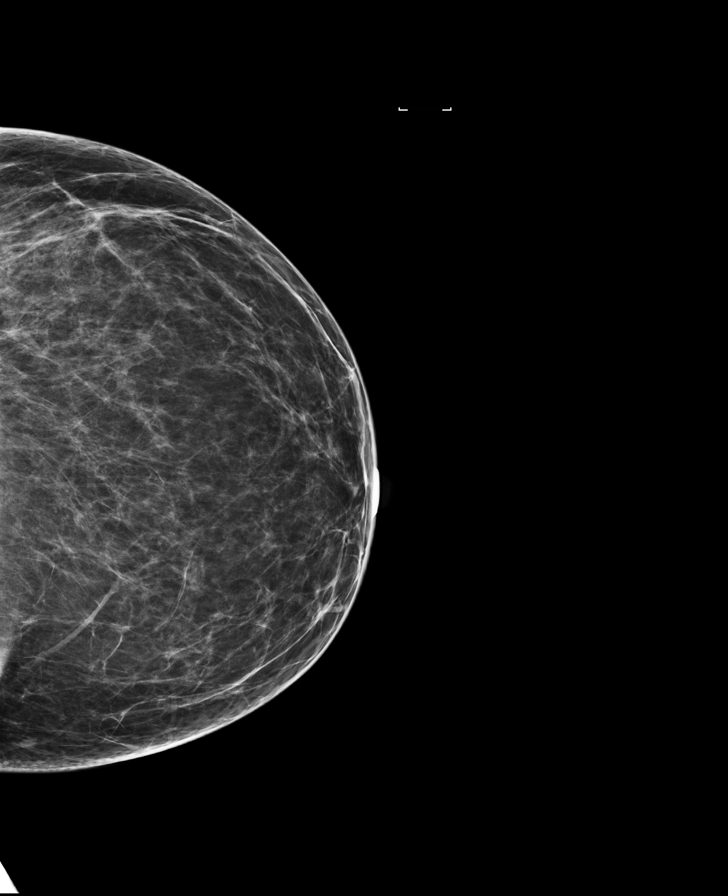

[R CC synth-2D]
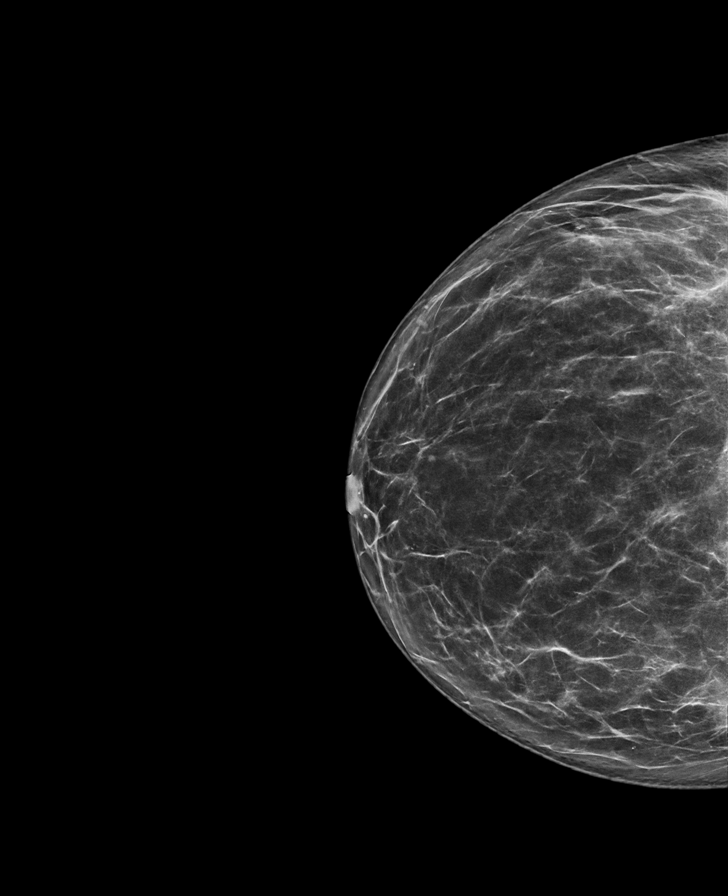

[R MLO]
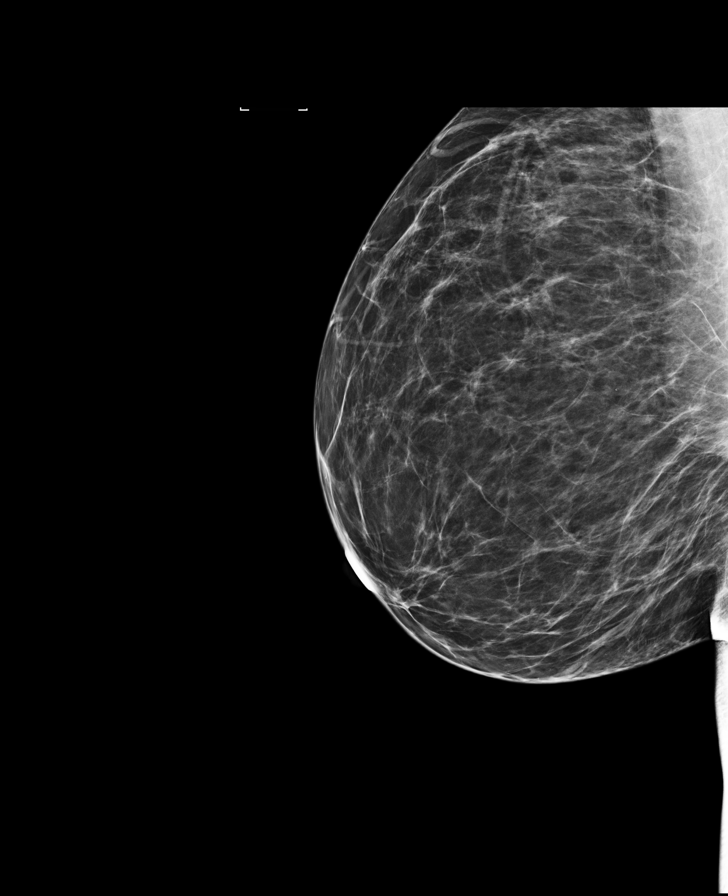

[8 of 28 positions shown; findings below may reference images not displayed]

ACR Breast Density Category b: There are scattered areas of
fibroglandular density.
FINDINGS: There are no findings suspicious for malignancy. Images were
processed with CAD.
IMPRESSION: No mammographic evidence of malignancy. A result letter of this
screening mammogram will be mailed directly to the patient.

RECOMMENDATION:
Screening mammogram at age 38. (Code:KX-K-9TC)

BI-RADS CATEGORY  1: Negative.

## 2018-07-07 ENCOUNTER — Encounter: Payer: Self-pay | Admitting: Neurology

## 2018-09-15 ENCOUNTER — Encounter: Payer: Self-pay | Admitting: Neurology

## 2018-09-15 ENCOUNTER — Ambulatory Visit (INDEPENDENT_AMBULATORY_CARE_PROVIDER_SITE_OTHER): Payer: 59 | Admitting: Neurology

## 2018-09-15 VITALS — BP 100/70 | HR 74 | Ht 66.0 in | Wt 200.4 lb

## 2018-09-15 DIAGNOSIS — R202 Paresthesia of skin: Secondary | ICD-10-CM

## 2018-09-15 NOTE — Progress Notes (Signed)
[facility] Clinic Note - Initial Visit   Date: 09/15/18  [name] MRN: [id number] DOB: 1981/09/06   Dear [name], NP:  Thank you for your kind referral of Jamie Meza for consultation of paresthesias of the left side. Although her history is well known to you, please allow Jamie Meza to reiterate it for the purpose of our medical record. The patient was accompanied to the clinic by self.   History of Present Illness: Jamie Meza is a 37 y.o. left-handed African American female with seasonal allergies and history of uterine fibroids presenting for evaluation of left hand and foot paresthesias.  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   Starting around February 2019, she began having numbness and tingling over left palm and left foot.  She also has swelling of the hands and has noticed difficulty putting her rings on.  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  There is no specific pattern to the numbness of her foot.  Exercises can sometimes trigger it. She does not have neck or back pain or radicular symptoms. She was evaluated at [facility] by [name], NP and had NCS/EMG of the left side as well as serology testing for paresthesias which was normal.  She is here for second opinion.  Out-side paper records, electronic medical record, and images have been reviewed where available and summarized as:  NCS/EMG of the left side 07/07/2018:  This is a normal study. At this time there is no electrodiagnostic evidence of a widespread peripheral neuropathy, myopathy, left cervical radiculopathy or left lumbosacral radiculopathy.  Labs 04/2018: ANA neg, ferritin 57, folate 11.6, HbA1c 5.6, SPEP with IFE no M protein, TSH 1.93, vitamin B12 951, vitamin B6 26.9, MMA 98, RPR neg  Past Medical History:  Diagnosis Date  . Recurrent UTI    occur yearly     Past Surgical History:  Procedure Laterality Date  . IR GENERIC HISTORICAL  08/22/2016   IR ANGIOGRAM SELECTIVE EACH ADDITIONAL VESSEL 08/22/2016 Greggory Keen, MD [facility]  . IR GENERIC HISTORICAL  08/22/2016   IR ANGIOGRAM SELECTIVE EACH ADDITIONAL VESSEL 08/22/2016 Greggory Keen, MD [facility]  . IR GENERIC HISTORICAL  08/22/2016   IR ANGIOGRAM PELVIS SELECTIVE OR SUPRASELECTIVE 08/22/2016 Greggory Keen, MD [facility]  . IR GENERIC HISTORICAL  08/22/2016   IR Jamie Meza GUIDE VASC ACCESS RIGHT 08/22/2016 Greggory Keen, MD [facility]  . IR GENERIC HISTORICAL  08/22/2016   IR ANGIOGRAM PELVIS SELECTIVE OR SUPRASELECTIVE 08/22/2016 Greggory Keen, MD [facility]  . IR GENERIC HISTORICAL  08/22/2016   IR EMBO ARTERIAL NOT HEMORR HEMANG INC GUIDE ROADMAPPING 08/22/2016 Greggory Keen, MD [facility]  . IR GENERIC HISTORICAL  06/17/2016   IR RADIOLOGIST EVAL & MGMT 06/17/2016 Greggory Keen, MD [facility]  . IR GENERIC HISTORICAL  09/10/2016   IR RADIOLOGIST EVAL & MGMT 09/10/2016 [facility]  . TUBAL LIGATION  2006     Medications:  Outpatient Encounter Medications as of 09/15/2018  Medication Sig  . ibuprofen (ADVIL) 200 MG tablet Take 4 tablets (800 mg total) by mouth every 8 (eight) hours as needed for mild pain, moderate pain or cramping.  . Loratadine-Pseudoephedrine (CLARITIN-D 12 HOUR PO) Take 1 tablet by mouth daily as needed (allergies/congestion).  . [DISCONTINUED] docusate sodium (COLACE) 100 MG capsule Take 1 capsule (100 mg total) by mouth 2 (two) times  daily. (Patient not taking: Reported on 09/10/2016)  . [DISCONTINUED] ondansetron (ZOFRAN) 8 MG tablet Take 1 tablet (8 mg total) by mouth every 8 (eight) hours as needed for nausea or vomiting. (Patient not taking: Reported on 09/10/2016)  . [DISCONTINUED] oxyCODONE-acetaminophen (ROXICET) 5-325 MG tablet Take 1-2 tablets by mouth every 4 (four) hours as needed for severe pain. (Patient not taking: Reported on  09/10/2016)   No facility-administered encounter medications on file as of 09/15/2018.      Allergies: No Known Allergies  Family History: Family History  Problem Relation Age of Onset  . Breast cancer Mother 89  . Breast cancer Maternal Aunt 34  . Prostate cancer Maternal Uncle   . Stroke Maternal Grandmother   . Diabetes Maternal Grandmother   . Heart attack Father   . Breast cancer Maternal Aunt 45       BRCA negative  . Prostate cancer Maternal Uncle     Social History: Social History   Tobacco Use  . Smoking status: Never Smoker  . Smokeless tobacco: Never Used  Substance Use Topics  . Alcohol use: No  . Drug use: No   Social History   Social History Narrative   Equities trader- criminal justice.   4 children- oldest is 11 girl, 51 yr old step son, 64 yr old daughter, 5 year son   Married.  Lives in a 2 story home.    Works for [employer].    Review of Systems:  CONSTITUTIONAL: No fevers, chills, night sweats, or weight loss.   EYES: No visual changes or eye pain ENT: No hearing changes.  No history of nose bleeds.   RESPIRATORY: No cough, wheezing and shortness of breath.   CARDIOVASCULAR: Negative for chest pain, and palpitations.   GI: Negative for abdominal discomfort, blood in stools or black stools.  No recent change in bowel habits.   GU:  No history of incontinence.   MUSCLOSKELETAL: No history of joint pain or swelling.  No myalgias.   SKIN: Negative for lesions, rash, and itching.   HEMATOLOGY/ONCOLOGY: Negative for prolonged bleeding, bruising easily, and swollen nodes.  No history of cancer.   ENDOCRINE: Negative for cold or heat intolerance, polydipsia or goiter.   PSYCH:  No depression or anxiety symptoms.   NEURO: As Above.   Vital Signs:  BP 100/70   Pulse 74   Ht '5\' 6"'$  (1.676 m)   Wt 200 lb 6 oz (90.9 kg)   SpO2 95%   BMI 32.34 kg/m    General Medical Exam:   General:  Well appearing, comfortable.   Eyes/ENT: see cranial nerve  examination.   Neck: No masses appreciated.  Full range of motion without tenderness.  No carotid bruits. Respiratory:  Clear to auscultation, good air entry bilaterally.   Cardiac:  Regular rate and rhythm, no murmur.   Extremities:  No deformities, edema, or skin discoloration.  Skin:  No rashes or lesions.  Neurological Exam: MENTAL STATUS including orientation to time, place, person, recent and remote memory, attention span and concentration, language, and fund of knowledge is normal.  Speech is not dysarthric.  CRANIAL NERVES: II:  No visual field defects.  Unremarkable fundi.   III-IV-VI: Pupils equal round and reactive to light.  Normal conjugate, extra-ocular eye movements in all directions of gaze.  No nystagmus.  No ptosis.   V:  Normal facial sensation.     VII:  Normal facial symmetry and movements.  No pathologic facial reflexes.  VIII:  Normal hearing and vestibular function.   IX-X:  Normal palatal movement.   XI:  Normal shoulder shrug and head rotation.   XII:  Normal tongue strength and range of motion, no deviation or fasciculation.  MOTOR:  No atrophy, fasciculations or abnormal movements.  No pronator drift.  Tone is normal.    Right Upper Extremity:    Left Upper Extremity:    Deltoid  5/5   Deltoid  5/5   Biceps  5/5   Biceps  5/5   Triceps  5/5   Triceps  5/5   Wrist extensors  5/5   Wrist extensors  5/5   Wrist flexors  5/5   Wrist flexors  5/5   Finger extensors  5/5   Finger extensors  5/5   Finger flexors  5/5   Finger flexors  5/5   Dorsal interossei  5/5   Dorsal interossei  5/5   Abductor pollicis  5/5   Abductor pollicis  5/5   Tone (Ashworth scale)  0  Tone (Ashworth scale)  0   Right Lower Extremity:    Left Lower Extremity:    Hip flexors  5/5   Hip flexors  5/5   Hip extensors  5/5   Hip extensors  5/5   Knee flexors  5/5   Knee flexors  5/5   Knee extensors  5/5   Knee extensors  5/5   Dorsiflexors  5/5   Dorsiflexors  5/5   Plantarflexors   5/5   Plantarflexors  5/5   Toe extensors  5/5   Toe extensors  5/5   Toe flexors  5/5   Toe flexors  5/5   Tone (Ashworth scale)  0  Tone (Ashworth scale)  0   MSRs:  Right                                                                 Left brachioradialis 2+  brachioradialis 2+  biceps 2+  biceps 2+  triceps 2+  triceps 2+  patellar 2+  patellar 2+  ankle jerk 2+  ankle jerk 2+  Hoffman no  Hoffman no  plantar response down  plantar response down   SENSORY:  Normal and symmetric perception of light touch, pinprick, vibration, and proprioception. Tinel's sign is negative at the wrists.  COORDINATION/GAIT: Normal finger-to- nose-finger.  Intact rapid alternating movements bilaterally.  Gait narrow based and stable. Tandem and stressed gait intact.    IMPRESSION: 1.  Left hand paresthesias, ?mild carpal tunnel syndrome give occupational risk.  Discussed that symptoms may be too mild to be detected on NCS and she can try wearing a wrist splint.    2.  Left foot numbness, unclear etiology.  NCS/EMG performed at [facility] as well as serology testing was personally viewed and is normal.  Additionally, her exam is normal and reassuring.  I doubt this is S1 radiculopathy given intact S1 reflex and lack of radicular symptoms.  I offered MRI brain to evaluate for demyelinating disease as her symptoms are patchy, although overall suspicion is low especially with normal exam.  She prefers to continue to monitor symptoms and consider imaging only if symptoms get worse.   Return to clinic in 4 months or sooner as needed  Thank you for allowing me to participate in patient's care.  If I can answer any additional questions, I would be pleased to do so.    Sincerely,    Donika K. Posey Pronto, DO

## 2018-09-15 NOTE — Patient Instructions (Signed)
Start using a wrist splint for your left hand and see if that helps  Monitor your left foot numbness, if it gets worse we can start physical therapy  If you develop new symptoms or this numbness gets worse, please call my office and we will order MRI brain   Return to clinic 4 months

## 2018-09-16 ENCOUNTER — Ambulatory Visit (INDEPENDENT_AMBULATORY_CARE_PROVIDER_SITE_OTHER): Payer: 59 | Admitting: Family Medicine

## 2018-09-16 ENCOUNTER — Encounter: Payer: Self-pay | Admitting: Family Medicine

## 2018-09-16 VITALS — BP 117/77 | HR 76 | Ht 65.0 in | Wt 199.7 lb

## 2018-09-16 DIAGNOSIS — R21 Rash and other nonspecific skin eruption: Secondary | ICD-10-CM | POA: Diagnosis not present

## 2018-09-16 DIAGNOSIS — Z8249 Family history of ischemic heart disease and other diseases of the circulatory system: Secondary | ICD-10-CM

## 2018-09-16 DIAGNOSIS — E669 Obesity, unspecified: Secondary | ICD-10-CM

## 2018-09-16 DIAGNOSIS — Z803 Family history of malignant neoplasm of breast: Secondary | ICD-10-CM

## 2018-09-16 DIAGNOSIS — R233 Spontaneous ecchymoses: Secondary | ICD-10-CM

## 2018-09-16 DIAGNOSIS — D649 Anemia, unspecified: Secondary | ICD-10-CM | POA: Diagnosis not present

## 2018-09-16 DIAGNOSIS — D259 Leiomyoma of uterus, unspecified: Secondary | ICD-10-CM

## 2018-09-16 DIAGNOSIS — J3089 Other allergic rhinitis: Secondary | ICD-10-CM

## 2018-09-16 NOTE — Progress Notes (Signed)
New patient office visit note:   Impression and Recommendations:    1. Anemia, unspecified type   2. Nonblanching ecchymotic appearing rash   3. Spontaneous ecchymosis   4. Environmental and seasonal allergies   5. Uterine leiomyoma, unspecified location   6. Obesity, Class I, BMI 30-34.9   7. Family history of breast cancer in first degree relative-mom 71 with breast cancer, 2 maternal aunts in late 40s breast cancer   86. FHx: early coronary artery disease-father in his late 90s with MI      1. Non-Blanching Ecchymotic-Appearing Spots on Left Upper Thigh  - Per pt preference, Referral to Hematology placed today.  - labs near future.  - If any new symptoms come up, patient knows to let us know immediately.   2. BMI Counseling (30-34.9 ) Explained to patient what BMI refers to, and what it means medically.    Told patient to think about it as a "medical risk stratification measurement" and how increasing BMI is associated with increasing risk/ or worsening state of various diseases such as hypertension, hyperlipidemia, diabetes, premature OA, depression etc.  American Heart Association guidelines for healthy diet, basically Mediterranean diet, and exercise guidelines of 30 minutes 5 days per week or more discussed in detail.  Health counseling performed.  All questions answered.   3. Lifestyle & Preventative Health Maintenance - Advised patient to continue working toward exercising to improve overall mental, physical, and emotional health.    - Encouraged patient to engage in daily physical activity, especially a formal exercise routine.  Recommended that the patient eventually strive for at least 150 minutes of moderate cardiovascular activity per week according to guidelines established by the [facility].   - Healthy dietary habits encouraged, including low-carb, and high amounts of lean protein in diet.   - Patient should also consume adequate amounts of water - half of  body weight in oz of water per day.   Education and routine counseling performed. Handouts provided.   4. Follow-Up - Return for CPE as scheduled. - Need for blood work; will be drawn as recommended. - Otherwise, continue to return to specialists as established.   - Patient knows to call in if desired to address acute concerns, or discuss diet, exercise, or other health goals.   Orders Placed This Encounter  Procedures  . Ambulatory referral to Hematology   Gross side effects, risk and benefits, and alternatives of medications discussed with patient.  Patient is aware that all medications have potential side effects and we are unable to predict every side effect or drug-drug interaction that may occur.  Expresses verbal understanding and consents to current therapy plan and treatment regimen.  Return for CPE/ yrly physical, come fasting-we will get full set of blood work at that time.  Please see AVS handed out to patient at the end of our visit for further patient instructions/ counseling done pertaining to today's office visit.    Note:  This document was prepared using Dragon voice recognition software and may include unintentional dictation errors.  This document serves as a record of services personally performed by Mellody Dance, DO. It was created on her behalf by Toni Amend, a trained medical scribe. The creation of this record is based on the scribe's personal observations and the provider's statements to them.   I have reviewed the above medical documentation for accuracy and completeness and I concur.  Mellody Dance 09/17/18 2:17 PM    -------------------------------------------------------------------------------------------------------------------------------   Subjective:  Chief complaint:   Chief Complaint  Patient presents with  . Establish Care    HPI: Jamie Meza is a pleasant 37 y.o. female who presents to [place] at  [facility] today to review their medical history with me and establish care.   I asked the patient to review their chronic problem list with me to ensure everything was updated and accurate.    All recent office visits with other providers, any medical records that patient brought in etc  - I reviewed today.     We asked pt to get [name] their medical records from [facility] providers/ specialists that they had seen within the past 3-5 years- if they are in private practice and/or do not work for [employer], [facility], [place], [place] or [employer] owned practice.  Told them to call their specialists to clarify this if they are not sure.    Patient and family have been establishing here at the clinic.  Social History Married to husband. Mother.  Does 60 minutes of cardio 4 days per week. Notes she just started back because she struggles with her weight. Lost 30 lbs about 2 years ago; fell off the wagon holiday season and trying to resume.  Never smoker, does not drink alcohol.  Family History Mom had breast cancer.  Was age 44 at onset. When she passed, it had metastasized.  Two maternal aunts with breast cancer. One was in her 76's, she passed in '93.  Dad had a heart attack in his late 31's. It's been 4 years ago.  He passed in his sleep.  Maternal grandfather with stroke in 53's-80's.  First cousin with MS. First cousin with Lupus.   Surgical History Past Surgical History:  Procedure Laterality Date  . IR GENERIC HISTORICAL  08/22/2016   IR ANGIOGRAM SELECTIVE EACH ADDITIONAL VESSEL 08/22/2016 Greggory Keen, MD [facility]  . IR GENERIC HISTORICAL  08/22/2016   IR ANGIOGRAM SELECTIVE EACH ADDITIONAL VESSEL 08/22/2016 Greggory Keen, MD [facility]  . IR GENERIC HISTORICAL  08/22/2016   IR ANGIOGRAM PELVIS SELECTIVE OR SUPRASELECTIVE 08/22/2016 Greggory Keen, MD [facility]  . IR GENERIC HISTORICAL  08/22/2016   IR US GUIDE VASC ACCESS RIGHT 08/22/2016 Greggory Keen, MD [facility]  .  IR GENERIC HISTORICAL  08/22/2016   IR ANGIOGRAM PELVIS SELECTIVE OR SUPRASELECTIVE 08/22/2016 Greggory Keen, MD [facility]  . IR GENERIC HISTORICAL  08/22/2016   IR EMBO ARTERIAL NOT HEMORR HEMANG INC GUIDE ROADMAPPING 08/22/2016 Greggory Keen, MD [facility]  . IR GENERIC HISTORICAL  06/17/2016   IR RADIOLOGIST EVAL & MGMT 06/17/2016 Greggory Keen, MD [facility]  . IR GENERIC HISTORICAL  09/10/2016   IR RADIOLOGIST EVAL & MGMT 09/10/2016 [facility]  . TUBAL LIGATION  2006    Past Medical History Patient states she has never been told she has chronic issues.  - Family History of Breast Cancer Patient goes for her mammograms every year. Started shortly after her mother passed, at age 23. Patient does not carry the gene, but one of her aunts that is deceased didn't carry the gene either.  - OBGYN Sees Dr. Garwin Brothers at [facility].  - Neurology - Left Hand and Left Foot Numbness & Tingling Dr. Narda Amber. Patient sees a neurologist due to issues with her left hand; numbness/tingling. The numbness and tingling is occasional, not even every day.  Her blood work, muscle test, and nerve conduction study was good. Neurology thinks it's a mild  case of carpal tunnel.  Patient continues to follow up as scheduled and monitors her symptoms.  - Spots on Left Leg First noticed this back in February Thought it was a bruise at first, because it looked like a bruise. States that it started as one place, but now it's 3-4 places. This "bruise-like" spot has never gone away.  About two months ago, her husband noticed that it had spread and was "more."  She went to Urgent Care, was sent to [facility], had blood work done.  Denies associated soreness. Denies body pains, belly pains; denies other new symptoms. Denies easy bleeding, bleeding disorders, or family history of bleeding disorders.   Wt Readings from Last 3 Encounters:  09/16/18 199 lb 11.2 oz (90.6 kg)  09/15/18 200 lb 6  oz (90.9 kg)  08/22/16 185 lb (83.9 kg)   BP Readings from Last 3 Encounters:  09/16/18 117/77  09/15/18 100/70  09/10/16 107/69   Pulse Readings from Last 3 Encounters:  09/16/18 76  09/15/18 74  09/10/16 79   BMI Readings from Last 3 Encounters:  09/16/18 33.23 kg/m  09/15/18 32.34 kg/m  08/22/16 29.86 kg/m    Patient Care Team    Relationship Specialty Notifications Start End  Mellody Dance, DO PCP - General Family Medicine  09/16/18   Alda Berthold, [place] Physician Neurology  09/16/18   Servando Salina, MD Consulting Physician Obstetrics and Gynecology  09/16/18     Patient Active Problem List   Diagnosis Date Noted  . Nonblanching rash 09/16/2018  . Spontaneous ecchymosis 09/16/2018  . Obesity, Class I, BMI 30-34.9 09/16/2018  . Family history of breast cancer in first degree relative 09/16/2018  . FHx: early coronary artery disease-father in his late 9s with MI 09/16/2018  . Uterine leiomyoma 08/22/2016  . Fibroids 08/22/2016  . Environmental and seasonal allergies 03/18/2012  . Allergic rhinitis 03/18/2012  . Routine health maintenance 01/04/2012  . UTI (lower urinary tract infection) 09/23/2011  . Anemia 06/26/2011  . General medical examination 06/24/2011  . Otalgia of right ear 05/23/2011  . Headache(784.0) 05/23/2011  . Headache 05/23/2011     Past Medical History:  Diagnosis Date  . Recurrent UTI    occur yearly     Past Medical History:  Diagnosis Date  . Recurrent UTI    occur yearly     Past Surgical History:  Procedure Laterality Date  . IR GENERIC HISTORICAL  08/22/2016   IR ANGIOGRAM SELECTIVE EACH ADDITIONAL VESSEL 08/22/2016 Greggory Keen, MD [facility]  . IR GENERIC HISTORICAL  08/22/2016   IR ANGIOGRAM SELECTIVE EACH ADDITIONAL VESSEL 08/22/2016 Greggory Keen, MD [facility]  . IR GENERIC HISTORICAL  08/22/2016   IR ANGIOGRAM PELVIS SELECTIVE OR SUPRASELECTIVE 08/22/2016 Greggory Keen, MD [facility]  .  IR GENERIC HISTORICAL  08/22/2016   IR US GUIDE VASC ACCESS RIGHT 08/22/2016 Greggory Keen, MD [facility]  . IR GENERIC HISTORICAL  08/22/2016   IR ANGIOGRAM PELVIS SELECTIVE OR SUPRASELECTIVE 08/22/2016 Greggory Keen, MD [facility]  . IR GENERIC HISTORICAL  08/22/2016   IR EMBO ARTERIAL NOT HEMORR HEMANG INC GUIDE ROADMAPPING 08/22/2016 Greggory Keen, MD [facility]  . IR GENERIC HISTORICAL  06/17/2016   IR RADIOLOGIST EVAL & MGMT 06/17/2016 Greggory Keen, MD [facility]  . IR GENERIC HISTORICAL  09/10/2016   IR RADIOLOGIST EVAL & MGMT 09/10/2016 [facility]  . TUBAL LIGATION  2006     Family History  Problem Relation Age of Onset  .  Breast cancer Mother 26  . Breast cancer Maternal Aunt 34  . Prostate cancer Maternal Uncle   . Stroke Maternal Grandmother   . Diabetes Maternal Grandmother   . Heart attack Father   . Breast cancer Maternal Aunt 45       BRCA negative  . Prostate cancer Maternal Uncle      Social History   Substance and Sexual Activity  Drug Use No     Social History   Substance and Sexual Activity  Alcohol Use No     Social History   Tobacco Use  Smoking Status Never Smoker  Smokeless Tobacco Never Used     Current Meds  Medication Sig  . ibuprofen (ADVIL) 200 MG tablet Take 4 tablets (800 mg total) by mouth every 8 (eight) hours as needed for mild pain, moderate pain or cramping.  . Loratadine-Pseudoephedrine (CLARITIN-D 12 HOUR PO) Take 1 tablet by mouth daily as needed (allergies/congestion).    Allergies: Patient has no known allergies.   Review of Systems  Constitutional: Negative for chills, diaphoresis, fever, malaise/fatigue and weight loss.  HENT: Negative for congestion, sore throat and tinnitus.   Eyes: Negative for blurred vision, double vision and photophobia.  Respiratory: Negative for cough and wheezing.   Cardiovascular: Negative for chest pain and palpitations.  Gastrointestinal: Negative for blood in stool,  diarrhea, nausea and vomiting.  Genitourinary: Negative for dysuria, frequency and urgency.  Musculoskeletal: Positive for joint pain (chronic) and myalgias (chronic).  Skin: Positive for rash (chronic). Negative for itching.  Neurological: Negative for dizziness, focal weakness, weakness and headaches.  Endo/Heme/Allergies: Negative for environmental allergies and polydipsia. Bruises/bleeds easily (chronic).  Psychiatric/Behavioral: Negative for depression and memory loss. The patient is nervous/anxious (chronic). The patient does not have insomnia.      Objective:   Blood pressure 117/77, pulse 76, height 5' 5" (1.651 m), weight 199 lb 11.2 oz (90.6 kg). Body mass index is 33.23 kg/m. General: Well Developed, well nourished, and in no acute distress.  Neuro: Alert and oriented x3, extra-ocular muscles intact, sensation grossly intact.  HEENT:[place]/AT, PERRLA, neck supple, No carotid bruits Skin:  Non-blanching discrete smaller areas of what appears to look like ecchymosis; about ten different discrete areas on the posterior aspect left upper thigh.  Otherwise warm, dry. Cardiac: Regular rate and rhythm Respiratory: Essentially clear to auscultation bilaterally. Not using accessory muscles, speaking in full sentences.  Abdominal: not grossly distended Musculoskeletal: Ambulates w/o diff, FROM * 4 ext.  Vasc: less 2 sec cap RF, warm and pink  Psych:  No HI/SI, judgement and insight good, Euthymic mood. Full Affect.   No results found for this or any previous visit (from the past 2160 hour(s)).

## 2018-09-22 ENCOUNTER — Telehealth: Payer: Self-pay | Admitting: Hematology and Oncology

## 2018-09-22 NOTE — Telephone Encounter (Signed)
Received a call from [facility] at Dr. Hershal Coria office to schedule the pt an appt. Pt was originally scheduled at [facility] location but wanted an appt sooner than November. Pt has been scheduled to see Dr. Audelia Hives on 10/22 at [place] will notify the pt.

## 2018-09-27 ENCOUNTER — Encounter

## 2018-09-27 ENCOUNTER — Ambulatory Visit: Payer: 59 | Admitting: Neurology

## 2018-09-27 NOTE — Progress Notes (Signed)
[facility] Initial Consultation  Patient Name:  Jamie Meza  DOB: 08-06-1981   Date of Service: September 28, 2018  Referring Provider: Mellody Dance DO [address] [place], [place] [address]   Consulting Physician: Henreitta Leber, MD Hematology/Oncology  Reason for Referral: In the setting of persistent nonblanching hyperpigmentation of the left posterior upper thigh over a 39-monthperiod, she presents now for further diagnostic and therapeutic recommendations to exclude an underlying bleeding tendency.  History Present Illness: [place] 37[place] [facility] [place] for frequent urinary tract infections, elevated BMI; and allergic rhinitis.  Her primary care physician is Dr. DMellody Dance  She is accompanied by her attentive husband, Jamie Meza.  Beginning in March, she developed a quarter size flat hyperpigmented area the size of a quarter with irregular margins.  That area persisted and enlarged.  It now has nearly 8 clustered hyperpigmented areas measuring 2-3 cm in maximal dimension.  They are nonpruritic and nonpainful.  They are located in no other part of her body.  She works at home through spending most of her time in front of 3 computers.  Her job is predominantly sedentary.  She complains of achiness in the legs bilaterally on intermittent basis and that same achiness in the right arm and lower back. She takes on a near daily basis 800 mg of ibuprofen or 800 mg of naproxen. She experiences nausea especially in the morning, generally once weekly.  For the past 6-9 months she has has numbness and tingling in the left hand and foot.  She menstruates on a monthly basis with normal 4-5 days of menstruation.  Her flow is moderate and unchanged over her usual baseline.  She is a gravida 5 para 3 miscarriage 1 abortion 1. She has no diabetes mellitus, coronary artery disease,  angina, or coronary artery disease.  She denies seizure disorder or stroke syndrome.  She has no thyroid disease.  There is no peptic ulcer or gastroesophageal reflux disease.  She reports no viral hepatitis, inflammatory bowel disease, or symptomatic diverticulosis.  She has never had a screening colonoscopy.  She has no rheumatoid or gouty arthritis.  She denies peripheral arterial venous thromboembolic disease.  Jamie Meza any appetite deficit.  Her weight has increased over the last year.  She has no visual changes or hearing deficit.  For the past several years she has had frontal headaches without dizziness, lightheadedness, syncope, or near syncopal episodes.  She has no imbalance or disequilibrium.  She denies any visual changes or hearing deficit.  She has no unusual cough, sore throat, orthopnea.  She has no dyspnea at rest.  She has chronic but stable exertional dyspnea.  There is no pain or difficulty in swallowing.  No fever, shaking chills, sweats, or flulike symptoms are reported.  She has no heartburn or indigestion.  She denies diarrhea or constipation.  There is no melena or bright red blood per rectum.  No urinary frequency, urgency, hematuria, or dysuria are evident.  She has occasional swelling of her left foot on an intermittent basis.  She denies epistaxis or hemoptysis.  It is with this background she presents now for further discussion and recommendations in the setting of a nonblanching flat hyperpigmented area behind her left upper thigh as outlined above.  Past Medical History:  Diagnosis Date  . Recurrent UTI    occur yearly   Past Surgical History:  Procedure Laterality Date  .  IR GENERIC HISTORICAL  08/22/2016   IR ANGIOGRAM SELECTIVE EACH ADDITIONAL VESSEL 08/22/2016 Greggory Keen, MD [facility]  . IR GENERIC HISTORICAL  08/22/2016   IR ANGIOGRAM SELECTIVE EACH ADDITIONAL VESSEL 08/22/2016 Greggory Keen, MD [facility]  . IR GENERIC HISTORICAL  08/22/2016   IR  ANGIOGRAM PELVIS SELECTIVE OR SUPRASELECTIVE 08/22/2016 Greggory Keen, MD [facility]  . IR GENERIC HISTORICAL  08/22/2016   IR US GUIDE VASC ACCESS RIGHT 08/22/2016 Greggory Keen, MD [facility]  . IR GENERIC HISTORICAL  08/22/2016   IR ANGIOGRAM PELVIS SELECTIVE OR SUPRASELECTIVE 08/22/2016 Greggory Keen, MD [facility]  . IR GENERIC HISTORICAL  08/22/2016   IR EMBO ARTERIAL NOT HEMORR HEMANG INC GUIDE ROADMAPPING 08/22/2016 Greggory Keen, MD [facility]  . IR GENERIC HISTORICAL  06/17/2016   IR RADIOLOGIST EVAL & MGMT 06/17/2016 Greggory Keen, MD [facility]  . IR GENERIC HISTORICAL  09/10/2016   IR RADIOLOGIST EVAL & MGMT 09/10/2016 [facility]  . TUBAL LIGATION  2006   Gynecologic History: Her menarche was at age 54 years. Her last menses was on October 7. She was on oral contraception at the age of 37 years for 6 months.   Her last screening mammogram was in October 2018. Her last Pap smear was in October 2018, reportedly normal.   She has never had a breast biopsy.  Family History  Problem Relation Age of Onset  . Breast cancer Mother 89  . Breast cancer Maternal Aunt 34  . Prostate cancer Maternal Uncle   . Stroke Maternal Grandmother   . Diabetes Maternal Grandmother   . Heart attack Father   . Breast cancer Maternal Aunt 45       BRCA negative  . Prostate cancer Maternal Uncle   Mother: Deceased: Age 73 years: Breast cancer Father: Deceased: 47s: MI Brothers (3): No major medical problems.  Social History   Socioeconomic History  . Marital status: Married    Spouse name: Not on file  . Number of children: 3  . Years of education: 61  . Highest education level: Not on file  Occupational History    Employer: [employer]  . Financial resource strain: Not on file  . Food insecurity:    Worry: Not on file    Inability: Not on file  . Transportation needs:    Medical: Not on file    Non-medical: Not on file  Tobacco Use  .  Smoking status: Never Smoker  . Smokeless tobacco: Never Used  Substance and Sexual Activity  . Alcohol use: No  . Drug use: No  . Sexual activity: Not on file  Lifestyle  . Physical activity:    Days per week: Not on file    Minutes per session: Not on file  . Stress: Not on file  Relationships  . Social connections:    Talks on phone: Not on file    Gets together: Not on file    Attends religious service: Not on file    Active member of club or organization: Not on file    Attends meetings of clubs or organizations: Not on file    Relationship status: Not on file  . Intimate partner violence:    Fear of current or ex partner: Not on file    Emotionally abused: Not on file    Physically abused: Not on file    Forced sexual activity: Not on file  Other Topics Concern  . Not on file  Social History Narrative   Equities trader- criminal justice.   4 children- oldest is 63 girl, 109 yr old step son, 68 yr old daughter, 5 year son   Married.  Lives in a 2 story home.    Works for [employer].  Jamie Meza is married for the past 14 years. She works in Therapist, art with [employer] She has 3 children without radical problems. She is a lifetime non-smoker. Her alcohol intake consists of wine generally on the weekends. She reports no recreational drug use.  Transfusion History: No prior transfusion  Exposure History: She has no known exposure to toxic chemicals, radiation, or pesticides.  Allergies:  She has no known medical allergies No food allergies She has nonspecific seasonal allergies  Current Outpatient Medications on File Prior to Visit  Medication Sig  . ibuprofen (ADVIL) 200 MG tablet Take 4 tablets (800 mg total) by mouth every 8 (eight) hours as needed for mild pain, moderate pain or cramping. (Patient not taking: Reported on 09/28/2018)  . Loratadine-Pseudoephedrine (CLARITIN-D 12 HOUR PO) Take 1 tablet by mouth daily as needed (allergies/congestion).   No current  facility-administered medications on file prior to visit.   Naproxen: 800 mg: 4 tablets as needed once daily  Review of Systems: Constitutional: No fever, sweats, or shaking chills.  No appetite or weight deficit. Skin: No rash, scaling, sores, lumps, or jaundice. HEENT: No visual changes or hearing deficit. Pulmonary: No unusual cough, sore throat, or orthopnea; DOE. Breasts: No complaints. Cardiovascular: No coronary artery disease, angina, or myocardial infarction.  No cardiac dysrhythmia, essential hypertension, or dyslipidemia. Gastrointestinal: No indigestion, dysphagia, abdominal pain, diarrhea, or constipation.  No change in bowel habits. Genitourinary: No urinary frequency, urgency, hematuria, or dysuria. Musculoskeletal: Occasional pain in the left foot.  No new arthralgias.  Generalized myalgias involving the legs bilaterally and right arm on an intermittent basis; no joint swelling, pain, or instability. Hematologic: No bleeding tendency. Endocrine: No intolerance to heat or cold; no thyroid disease or diabetes mellitus. Vascular: No peripheral arterial or venous thromboembolic disease. Psychological: No anxiety, depression, or mood changes; no mental health illnesses. Neurological: No dizziness, lightheadedness, syncope, or near syncopal episodes; no numbness   Physical Examination: Vital Signs: Body surface area is 2.03 meters squared.  Vitals:   09/28/18 0852  BP: 110/74  Pulse: 73  Resp: 17  Temp: 98.4 F (36.9 C)  SpO2: 100%    Filed Weights   09/28/18 0852  Weight: 198 lb 3.2 oz (89.9 kg)  ECOG PERFORMANCE STATUS: 0 Constitutional:  Jamie Meza is fully nourished and developed.  She looks age appropriate. She is friendly and cooperative without respiratory compromise at rest. Skin: No rashes, scaling, dryness, jaundice, or itching; clusters of hyperpigmented, irregularly bordered nonblanching, flat areas measuring 2-3 cm in the posterior left upper thigh  (8). HEENT: Head is normocephalic and atraumatic.  Pupils are equal round and reactive to light and accommodation.  Sclerae are anicteric.  Conjunctivae are pink.  No sinus tenderness nor oropharyngeal lesions.  Lips without cracking or peeling; tongue without mass, inflammation, or nodularity.  Mucous membranes are moist. Neck: Supple and symmetric.  No jugular venous distention or thyromegaly.  Trachea is midline. Lymphatics: No cervical or supraclavicular lymphadenopathy.  No epitrochlear, axillary, or inguinal lymphadenopathy is appreciated. Respiratory/chest: Thorax is symmetrical.  Breath sounds are clear to auscultation and percussion.  Normal excursion and respiratory effort. Back: Symmetric without deformity or tenderness. Cardiovascular: Heart rate and rhythm are regular without murmurs. Gastrointestinal: Abdomen is  soft, nontender; no organomegaly.  Bowel sounds are normoactive.  No masses are appreciated. Extremities: In the lower extremities, there is no asymmetric swelling, erythema, tenderness, or cord formation.  No clubbing, cyanosis, nor edema. Hematologic: No petechiae, hematomas, or ecchymoses. Psychological:  She is oriented to person, place, and time; normal affect, memory, and cognition. Neurological: There are no gross neurologic deficits.  Laboratory Results: I have reviewed the data as listed: September 28, 2018  Ref Range & Units 10:55 43yrago 737yrgo  WBC Count 4.0 - 10.5 K/uL 8.5  7.2  7.9   RBC 3.87 - 5.11 MIL/uL 3.86Low   3.75Low   3.85Low    Hemoglobin 12.0 - 15.0 g/dL 11.3Low   11.0Low   11.4Low    HCT 36.0 - 46.0 % 36.3  32.9Low   35.3Low    MCV 80.0 - 100.0 fL 94.0  87.7 R 91.7 R  MCH 26.0 - 34.0 pg 29.3  29.3  29.6   MCHC 30.0 - 36.0 g/dL 31.1  33.4  32.3   RDW 11.5 - 15.5 % 13.3  13.5  13.7   Platelet Count 150 - 400 K/uL 259  260  254   nRBC 0.0 - 0.2 % 0.0     Neutrophils Relative % % 55  56  63 R  Neutro Abs 1.7 - 7.7 K/uL 4.8  4.0  4.9   Lymphocytes  Relative % 36  34  31 R  Lymphs Abs 0.7 - 4.0 K/uL 3.0  2.5  2.5   Monocytes Relative % _0 R  Monocytes Absolute 0.1 - 1.0 K/uL 0.5  0.5  0.3   Eosinophils Relative % _1 R  Eosinophils Absolute 0.0 - 0.5 K/uL 0.1  0.2 R 0.1 R  Basophils Relative % 1  0  0 R  Basophils Absolute 0.0 - 0.1 K/uL 0.1  0.0  0.0   Immature Granulocytes % 0     Abs Immature Granulocytes 0.00 - 0.07 K/uL 0.01      CMP Latest Ref Rng & Units 09/28/2018 08/22/2016 06/24/2011  Glucose 70 - 99 mg/dL 89 81 77  BUN 6 - 20 mg/dL _2 Creatinine 0.44 - 1.00 mg/dL 0.87 0.80 0.89  Sodium 135 - 145 mmol/L 142 137 137  Potassium 3.5 - 5.1 mmol/L 4.4 3.7 4.4  Chloride 98 - 111 mmol/L 109 107 105  CO2 22 - 32 mmol/L _3 Calcium 8.9 - 10.3 mg/dL 9.8 9.4 9.6  Total Protein 6.5 - 8.1 g/dL 7.8 - 7.6  Total Bilirubin 0.3 - 1.2 mg/dL 0.4 - 0.4  Alkaline Phos 38 - 126 U/L 59 - 43  AST 15 - 41 U/L 18 - 25  ALT 0 - 44 U/L 14 - 14  PT/INR 13.3/1.02 aPTT 29 Fibrinogen 360  Summary/Assessment: In the setting of persistent nonblanching hyperpigmentation of the left posterior upper thigh over a 6-65-monthriod, she presents now for further diagnostic and therapeutic recommendations to exclude an underlying bleeding tendency.  Beginning in March, she developed a quarter size flat hyperpigmented area the size of a quarter with irregular margins. That area persisted and enlarged.  It is nonpainful and nonpruritic.  There are no nearly 8 clustered hyperpigmented areas measuring 2-3 cm in maximal dimension.  They are located in no other part of her body. She works at home, spending most of her time in front of 3 computers.  Her job is predominantly sedentary.  She complains of achiness in the legs bilaterally on intermittent basis and that same achiness persists in the right arm and lower back. She takes on a near daily basis 800 mg of ibuprofen or 800 mg or naproxen. She experiences nausea especially in the morning,  generally once weekly. For the past 6-9 months she has has numbness and tingling in the left hand and foot.  She menstruates on a monthly basis with normal 4-5 days of menstruation.  Her flow is moderate and unchanged over her usual baseline.  She is a gravida 5 para 3 miscarriage 1 abortion 1. She has no diabetes mellitus, coronary artery disease, angina, or coronary artery disease.  She denies seizure disorder or stroke syndrome.  She has no thyroid disease.  There is no peptic ulcer or gastroesophageal reflux disease.  She reports no viral hepatitis, inflammatory bowel disease, or symptomatic diverticulosis.  She has never had a screening colonoscopy.  She has no rheumatoid or gouty arthritis.  She denies peripheral arterial venous thromboembolic disease.  Jamie Meza denies any appetite deficit.  Her weight has increased over the last year.  She has no visual changes or hearing deficit.  For the past several years she has had frontal headaches without dizziness, lightheadedness, syncope, or near syncopal episodes.  She has no imbalance or disequilibrium.  She denies any visual changes or hearing deficit.  She has no unusual cough, sore throat, orthopnea.  She has no dyspnea at rest.  She has chronic but stable exertional dyspnea.  There is no pain or difficulty in swallowing.  No fever, shaking chills, sweats, or flulike symptoms are reported.  She has no heartburn or indigestion.  She denies diarrhea or constipation.  There is no melena or bright red blood per rectum.  No urinary frequency, urgency, hematuria, or dysuria are evident.  She has occasional swelling of her left foot on an intermittent basis.  She denies epistaxis or hemoptysis.  It is with this background she presents now for further discussion and recommendations in the setting of a nonblanching flat hyperpigmented area behind her left upper thigh as outlined above.  Her other comorbid problems include frequent urinary tract infections, elevated  BMI; and allergic rhinitis.    Recommendation/Plan: We discussed in detail her history over the past 6 months.  The area behind the left upper thigh does not have a typical appearance of either hematoma or ecchymosis.  It was recommended that a coagulation analysis be performed to exclude an underlying bleeding tendency.  Those results are present incomplete.  At present, both PT/INR/aPTT/fibrinogen are within normal limits.  Although she is mildly anemic, an anemia work-up was deferred at her request.  An ANA titer with reflex was requested in the event that she has an underlying connective tissue disorder or vasculitis.  If indeed her coagulation profile is normal, then I would consider a dermatology evaluation with possible biopsy.  Although she consumes large doses of NSAIDs, generally NSAIDs are associated with qualitative platelet dysfunction and bleeding from mucosal surfaces such as the nasal passage, gums, and vagina.  She has none of these problems.  Barring any unforeseen complications, her skin next scheduled doctor visit to discuss those results is on November 4.  She was advised to call us in the interim should any new or untoward problems arise.  The total time spent discussing the the laboratory evaluation for coagulopathy, preliminary considerations and recommendations was 60 minutes.  At least 50% of that time was spent in discussion, reviewing  records, counseling, and answering questions. All questions were answered to her satisfaction.   This note was dictated using voice activated technology/software.  Unfortunately, typographical errors are not uncommon, and transcription is subject to mistakes and regrettably misinterpretation.  If necessary, clarification of the above information can be discussed with me at any time.  Thank you Dr. Raliegh Scarlet for allowing my participation in the care of Jamie Meza. I will keep you closely informed as the results of her preliminary laboratory  data become available.  Please do not hesitate to call should any questions arise regarding this initial consultation and discussion.  FOLLOW UP: AS DIRECTED   cc:        Neoma Laming Opalski DO   Henreitta Leber, MD  Hematology/Oncology [facility] [address] [place], [place] [address] Office: [phone] CYOY: [phone]

## 2018-09-28 ENCOUNTER — Inpatient Hospital Stay: Payer: 59 | Attending: Hematology and Oncology | Admitting: Hematology and Oncology

## 2018-09-28 ENCOUNTER — Inpatient Hospital Stay: Payer: 59

## 2018-09-28 ENCOUNTER — Encounter: Payer: Self-pay | Admitting: Hematology and Oncology

## 2018-09-28 VITALS — BP 110/74 | HR 73 | Temp 98.4°F | Resp 17 | Ht 65.0 in | Wt 198.2 lb

## 2018-09-28 DIAGNOSIS — Z803 Family history of malignant neoplasm of breast: Secondary | ICD-10-CM | POA: Diagnosis not present

## 2018-09-28 DIAGNOSIS — Z791 Long term (current) use of non-steroidal anti-inflammatories (NSAID): Secondary | ICD-10-CM | POA: Insufficient documentation

## 2018-09-28 DIAGNOSIS — D509 Iron deficiency anemia, unspecified: Secondary | ICD-10-CM | POA: Insufficient documentation

## 2018-09-28 DIAGNOSIS — D699 Hemorrhagic condition, unspecified: Secondary | ICD-10-CM

## 2018-09-28 DIAGNOSIS — T148XXA Other injury of unspecified body region, initial encounter: Secondary | ICD-10-CM

## 2018-09-28 DIAGNOSIS — L819 Disorder of pigmentation, unspecified: Secondary | ICD-10-CM | POA: Insufficient documentation

## 2018-09-28 LAB — APTT: aPTT: 29 seconds (ref 24–36)

## 2018-09-28 LAB — COMPREHENSIVE METABOLIC PANEL
ALK PHOS: 59 U/L (ref 38–126)
ALT: 14 U/L (ref 0–44)
ANION GAP: 9 (ref 5–15)
AST: 18 U/L (ref 15–41)
Albumin: 4.3 g/dL (ref 3.5–5.0)
BILIRUBIN TOTAL: 0.4 mg/dL (ref 0.3–1.2)
BUN: 9 mg/dL (ref 6–20)
CALCIUM: 9.8 mg/dL (ref 8.9–10.3)
CO2: 24 mmol/L (ref 22–32)
Chloride: 109 mmol/L (ref 98–111)
Creatinine, Ser: 0.87 mg/dL (ref 0.44–1.00)
GLUCOSE: 89 mg/dL (ref 70–99)
POTASSIUM: 4.4 mmol/L (ref 3.5–5.1)
Sodium: 142 mmol/L (ref 135–145)
TOTAL PROTEIN: 7.8 g/dL (ref 6.5–8.1)

## 2018-09-28 LAB — CBC WITH DIFFERENTIAL (CANCER CENTER ONLY)
Abs Immature Granulocytes: 0.01 10*3/uL (ref 0.00–0.07)
BASOS PCT: 1 %
Basophils Absolute: 0.1 10*3/uL (ref 0.0–0.1)
EOS ABS: 0.1 10*3/uL (ref 0.0–0.5)
Eosinophils Relative: 2 %
HCT: 36.3 % (ref 36.0–46.0)
Hemoglobin: 11.3 g/dL — ABNORMAL LOW (ref 12.0–15.0)
IMMATURE GRANULOCYTES: 0 %
Lymphocytes Relative: 36 %
Lymphs Abs: 3 10*3/uL (ref 0.7–4.0)
MCH: 29.3 pg (ref 26.0–34.0)
MCHC: 31.1 g/dL (ref 30.0–36.0)
MCV: 94 fL (ref 80.0–100.0)
MONOS PCT: 6 %
Monocytes Absolute: 0.5 10*3/uL (ref 0.1–1.0)
NEUTROS PCT: 55 %
NRBC: 0 % (ref 0.0–0.2)
Neutro Abs: 4.8 10*3/uL (ref 1.7–7.7)
PLATELETS: 259 10*3/uL (ref 150–400)
RBC: 3.86 MIL/uL — AB (ref 3.87–5.11)
RDW: 13.3 % (ref 11.5–15.5)
WBC: 8.5 10*3/uL (ref 4.0–10.5)

## 2018-09-28 LAB — PROTIME-INR
INR: 1.02
Prothrombin Time: 13.3 seconds (ref 11.4–15.2)

## 2018-09-28 LAB — FIBRINOGEN: FIBRINOGEN: 360 mg/dL (ref 210–475)

## 2018-09-28 NOTE — Patient Instructions (Signed)
We discussed in detail your history with prolonged bruising in the left posterior thigh over the past 6 months.  Laboratory studies are requested to identify any coagulation abnormality or qualitative platelet deficit.  A complete blood count will be obtained to determine if you are anemic.   Barring any unforeseen complications, your next scheduled doctor visit to discuss those results is on November 4.  Please do not hesitate to call should any questions arise in the interim.  Thank you!  Ladona Ridgel, MD Hematology/Oncology

## 2018-09-29 LAB — THROMBIN TIME: Thrombin Time: 18.6 s (ref 0.0–23.0)

## 2018-09-30 ENCOUNTER — Telehealth: Payer: Self-pay | Admitting: Hematology

## 2018-09-30 NOTE — Telephone Encounter (Signed)
At 10/22 new patient consult patient requested to be transferred to another physician. Practice administrator informed as well as 10/22 consulting provider.  Spoke with YF re transfer of care and YG agreeable to seeing patient in 1-2 weeks. Spoke with patient today re 10/28 f/u with YF @ 8:15 am. Date/time due to patient requesting early AM f/u.

## 2018-10-01 NOTE — Progress Notes (Signed)
[facility]  [phone] [phone]  Clinic Follow up Note   Patient Care Team: Mellody Dance, DO as PCP - General (Family Medicine) Alda Berthold, DO as Consulting Physician (Neurology) Servando Salina, MD as Consulting Physician (Obstetrics and Gynecology)   Date of Service:  10/04/2018   CHIEF COMPLAIN: skin lesion on left thigh, rule out bleeding disorder, f/u lab results   INTERVAL HISTORY: Jamie Meza is here for a follow up of her last results from last week. She was initially seen by my partner Dr. Ladona Ridgel last week and had lab work, but requested a different provider for follow up. This is my first visit with her.   She presents to the clinic today with her husband, She has noticed a bruise on the back of her left thigh about 6 months ago  . She states that his bruise has been there for months now. She denies having bleeding at any other site, and doesn't remember trauma to that area. She denies gum or nose bleeding, blood on stool or urine, or other bruises. She has multiple NVDs and denies prolonged bleeding. She had embolization for heavy menses, and states that her cycles are lighter now. She had an ectopic pregnancy before, but denies miscarriages.  She has stable numbness in her LLs and is seeing a doctor for this. She denies family history of bleeding disorders. She denies prolonged bleeding or easy bruising.  She states that she previously had a genetic testing that she thinks was negative.    REVIEW OF SYSTEMS:   Constitutional: Denies fevers, chills or abnormal weight loss (+) occasional fatigue Eyes: Denies blurriness of vision Ears, nose, mouth, throat, and face: Denies mucositis or sore throat Respiratory: Denies cough, dyspnea or wheezes Cardiovascular: Denies palpitation, chest discomfort or lower extremity swelling Gastrointestinal:  Denies nausea, heartburn or change in bowel habits Skin: Denies abnormal  skin rashes (+) non tender large bruise on the back of her left thigh GU: (+) light menses (+) history of ectopic pregnancy Lymphatics: Denies new lymphadenopathy or easy bruising Neurological:Denies tingling or new weaknesses (+) numbness in legs  Behavioral/Psych: Mood is stable, no new changes  All other systems were reviewed with the patient and are negative.  MEDICAL HISTORY:  Past Medical History:  Diagnosis Date  . Recurrent UTI    occur yearly    SURGICAL HISTORY: Past Surgical History:  Procedure Laterality Date  . IR GENERIC HISTORICAL  08/22/2016   IR ANGIOGRAM SELECTIVE EACH ADDITIONAL VESSEL 08/22/2016 Greggory Keen, MD [facility]  . IR GENERIC HISTORICAL  08/22/2016   IR ANGIOGRAM SELECTIVE EACH ADDITIONAL VESSEL 08/22/2016 Greggory Keen, MD [facility]  . IR GENERIC HISTORICAL  08/22/2016   IR ANGIOGRAM PELVIS SELECTIVE OR SUPRASELECTIVE 08/22/2016 Greggory Keen, MD [facility]  . IR GENERIC HISTORICAL  08/22/2016   IR US GUIDE VASC ACCESS RIGHT 08/22/2016 Greggory Keen, MD [facility]  . IR GENERIC HISTORICAL  08/22/2016   IR ANGIOGRAM PELVIS SELECTIVE OR SUPRASELECTIVE 08/22/2016 Greggory Keen, MD [facility]  . IR GENERIC HISTORICAL  08/22/2016   IR EMBO ARTERIAL NOT HEMORR HEMANG INC GUIDE ROADMAPPING 08/22/2016 Greggory Keen, MD [facility]  . IR GENERIC HISTORICAL  06/17/2016   IR RADIOLOGIST EVAL & MGMT 06/17/2016 Greggory Keen, MD [facility]  . IR GENERIC HISTORICAL  09/10/2016   IR RADIOLOGIST EVAL & MGMT 09/10/2016 [facility]  . TUBAL LIGATION  2006    I have reviewed the social history  and family history with the patient and they are unchanged from previous note.  ALLERGIES:  has No Known Allergies.  MEDICATIONS:  Current Outpatient Medications  Medication Sig Dispense Refill  . ibuprofen (ADVIL) 200 MG tablet Take 4 tablets (800 mg total) by mouth every 8 (eight) hours as needed for mild pain, moderate pain or cramping. 30 tablet 0  .  Loratadine-Pseudoephedrine (CLARITIN-D 12 HOUR PO) Take 1 tablet by mouth daily as needed (allergies/congestion).     No current facility-administered medications for this visit.     PHYSICAL EXAMINATION: ECOG PERFORMANCE STATUS: 0 - Asymptomatic  Vitals:   10/04/18 0849  BP: 113/63  Pulse: 70  Resp: 18  Temp: 98.7 F (37.1 C)  SpO2: 100%   Filed Weights   10/04/18 0849  Weight: 198 lb 14.4 oz (90.2 kg)    GENERAL:alert, no distress and comfortable SKIN: skin color, texture, turgor are normal, no rashes, (+) multiple small flat patchy hyperpigmented area on her posterior thigh, non-blanching, with irregular board.  She also has a dark blue area in anterior left thigh, consistent with small ecchymosis.  No petechia or ecchymosis. EYES: normal, Conjunctiva are pink and non-injected, sclera clear OROPHARYNX:no exudate, no erythema and lips, buccal mucosa, and tongue normal  NECK: supple, thyroid normal size, non-tender, without nodularity LYMPH:  no palpable lymphadenopathy in the cervical, axillary or inguinal LUNGS: clear to auscultation and percussion with normal breathing effort HEART: regular rate & rhythm and no murmurs and no lower extremity edema ABDOMEN:abdomen soft, non-tender and normal bowel sounds Musculoskeletal:no cyanosis of digits and no clubbing  NEURO: alert & oriented x 3 with fluent speech, no focal motor/sensory deficits  LABORATORY DATA:  I have reviewed the data as listed CBC Latest Ref Rng & Units 09/28/2018 08/22/2016 06/24/2011  WBC 4.0 - 10.5 K/uL 8.5 7.2 7.9  Hemoglobin 12.0 - 15.0 g/dL 11.3(L) 11.0(L) 11.4(L)  Hematocrit 36.0 - 46.0 % 36.3 32.9(L) 35.3(L)  Platelets 150 - 400 K/uL 259 260 254     CMP Latest Ref Rng & Units 09/28/2018 08/22/2016 06/24/2011  Glucose 70 - 99 mg/dL 89 81 77  BUN 6 - 20 mg/dL 9 17 11   Creatinine 0.44 - 1.00 mg/dL 0.87 0.80 0.89  Sodium 135 - 145 mmol/L 142 137 137  Potassium 3.5 - 5.1 mmol/L 4.4 3.7 4.4  Chloride 98  - 111 mmol/L 109 107 105  CO2 22 - 32 mmol/L 24 23 19   Calcium 8.9 - 10.3 mg/dL 9.8 9.4 9.6  Total Protein 6.5 - 8.1 g/dL 7.8 - 7.6  Total Bilirubin 0.3 - 1.2 mg/dL 0.4 - 0.4  Alkaline Phos 38 - 126 U/L 59 - 43  AST 15 - 41 U/L 18 - 25  ALT 0 - 44 U/L 14 - 14      RADIOGRAPHIC STUDIES: I have personally reviewed the radiological images as listed and agreed with the findings in the report. No results found.   ASSESSMENT & PLAN:  Jamie Meza is a 37 y.o. female with a history of frequent UTIs, elevated BMI and allergic rhinitis.   1. Hyperpigmented skin lesion on posterior left thigh, No evidence or concern for bleeding disorders  -She has no personal or family history of bleeding disorder.  She had 3 normal vaginal bleeding, without excessive bleeding or required blood transfusion.   -On exam, she has only one small area of ecchymosis in the left anterior thigh, the hyperpigmented skin lesions on posterior left thigh are not consistent with ecchymosis. -  I reviewed her recent lab results, which showed normal CBC, PT, APTT, and bleeding time. I do see any evidence or concerns for bleeding disorder.  I do not think she needs further work-up at this point, based on her clinical history.   -I recommend her to see dermatology if she is still concerned   2.Mild Anemia, likely iron deficiency  -She had history of iron deficiency from menorrhagia.  She had endometrial embolization in September 2017, her menstrual.  Has been much lighter now.  -she still has mild anemia on lab, I advised her to take iron pills.  She is not very compliant, due to iron pill induced constipation. -Discussed role of IV iron, if needed in the future. -f/u with Dr. Garwin Brothers  3. Family history of breast cancer -She has a strong family history of breast cancer.  Per patient, she underwent genetic testing which was negative -I encouraged her to continue annual screening mammogram, she is overdue, I will order one for  her, she agrees to go.  -she can also consider screening breast MRI, she will discuss with Dr. Garwin Brothers  Patient and her husband had many questions, I answered to their satisfaction.  She knows to call me if she has any concerns or questions in the future.  PLAN -ordered mammogram today to be done next month  -f/u as needed     No problem-specific Assessment & Plan notes found for this encounter.   Orders Placed This Encounter  Procedures  . MM Digital Screening    Standing Status:   Future    Standing Expiration Date:   10/04/2019    Order Specific Question:   Reason for Exam (SYMPTOM  OR DIAGNOSIS REQUIRED)    Answer:   screening    Order Specific Question:   Is the patient pregnant?    Answer:   No    Order Specific Question:   Preferred imaging location?    Answer:   [facility]   All questions were answered. The patient knows to call the clinic with any problems, questions or concerns. No barriers to learning was detected. I spent 20 minutes counseling the patient face to face. The total time spent in the appointment was 25 minutes and more than 50% was on counseling and review of test results  I, Noor Dweik am acting as scribe for Dr. Truitt Merle.  I have reviewed the above documentation for accuracy and completeness, and I agree with the above.      Truitt Merle, MD 10/04/2018

## 2018-10-04 ENCOUNTER — Encounter: Payer: Self-pay | Admitting: Hematology

## 2018-10-04 ENCOUNTER — Inpatient Hospital Stay (HOSPITAL_BASED_OUTPATIENT_CLINIC_OR_DEPARTMENT_OTHER): Payer: 59 | Admitting: Hematology

## 2018-10-04 VITALS — BP 113/63 | HR 70 | Temp 98.7°F | Resp 18 | Ht 65.0 in | Wt 198.9 lb

## 2018-10-04 DIAGNOSIS — D509 Iron deficiency anemia, unspecified: Secondary | ICD-10-CM | POA: Diagnosis not present

## 2018-10-04 DIAGNOSIS — L819 Disorder of pigmentation, unspecified: Secondary | ICD-10-CM | POA: Diagnosis not present

## 2018-10-04 DIAGNOSIS — Z803 Family history of malignant neoplasm of breast: Secondary | ICD-10-CM

## 2018-10-04 DIAGNOSIS — Z791 Long term (current) use of non-steroidal anti-inflammatories (NSAID): Secondary | ICD-10-CM | POA: Diagnosis not present

## 2018-10-04 DIAGNOSIS — Z1231 Encounter for screening mammogram for malignant neoplasm of breast: Secondary | ICD-10-CM

## 2018-10-11 ENCOUNTER — Ambulatory Visit: Payer: 59 | Admitting: Hematology

## 2018-10-11 ENCOUNTER — Inpatient Hospital Stay: Payer: 59

## 2018-11-01 ENCOUNTER — Encounter: Payer: Self-pay | Admitting: Family Medicine

## 2018-11-01 ENCOUNTER — Other Ambulatory Visit: Payer: 59

## 2018-11-01 ENCOUNTER — Ambulatory Visit (INDEPENDENT_AMBULATORY_CARE_PROVIDER_SITE_OTHER): Payer: 59 | Admitting: Family Medicine

## 2018-11-01 VITALS — BP 115/80 | HR 83 | Temp 98.0°F | Ht 65.5 in | Wt 200.0 lb

## 2018-11-01 DIAGNOSIS — E669 Obesity, unspecified: Secondary | ICD-10-CM

## 2018-11-01 DIAGNOSIS — Z803 Family history of malignant neoplasm of breast: Secondary | ICD-10-CM

## 2018-11-01 DIAGNOSIS — Z0000 Encounter for general adult medical examination without abnormal findings: Secondary | ICD-10-CM

## 2018-11-01 DIAGNOSIS — Z23 Encounter for immunization: Secondary | ICD-10-CM

## 2018-11-01 DIAGNOSIS — Z1239 Encounter for other screening for malignant neoplasm of breast: Secondary | ICD-10-CM

## 2018-11-01 DIAGNOSIS — Z719 Counseling, unspecified: Secondary | ICD-10-CM | POA: Diagnosis not present

## 2018-11-01 DIAGNOSIS — D509 Iron deficiency anemia, unspecified: Secondary | ICD-10-CM

## 2018-11-01 DIAGNOSIS — Z0100 Encounter for examination of eyes and vision without abnormal findings: Secondary | ICD-10-CM

## 2018-11-01 NOTE — Patient Instructions (Addendum)
As discussed we will only bring you in to discuss abnormal labs if there is anything besides vitamin D that is deviated from the norm\ or if there are clinically significant lab abnormalities.     Preventive Care for Adults, Female  A healthy lifestyle and preventive care can promote health and wellness. Preventive health guidelines for women include the following key practices.   A routine yearly physical is a good way to check with your health care provider about your health and preventive screening. It is a chance to share any concerns and updates on your health and to receive a thorough exam.   Visit your dentist for a routine exam and preventive care every 6 months. Brush your teeth twice a day and floss once a day. Good oral hygiene prevents tooth decay and gum disease.   The frequency of eye exams is based on your age, health, family medical history, use of contact lenses, and other factors. Follow your health care provider's recommendations for frequency of eye exams.   Eat a healthy diet. Foods like vegetables, fruits, whole grains, low-fat dairy products, and lean protein foods contain the nutrients you need without too many calories. Decrease your intake of foods high in solid fats, added sugars, and salt. Eat the right amount of calories for you.Get information about a proper diet from your health care provider, if necessary.   Regular physical exercise is one of the most important things you can do for your health. Most adults should get at least 150 minutes of moderate-intensity exercise (any activity that increases your heart rate and causes you to sweat) each week. In addition, most adults need muscle-strengthening exercises on 2 or more days a week.   Maintain a healthy weight. The body mass index (BMI) is a screening tool to identify possible weight problems. It provides an estimate of body fat based on height and weight. Your health care provider can find your BMI, and  can help you achieve or maintain a healthy weight.For adults 20 years and older:   - A BMI below 18.5 is considered underweight.   - A BMI of 18.5 to 24.9 is normal.   - A BMI of 25 to 29.9 is considered overweight.   - A BMI of 30 and above is considered obese.   Maintain normal blood lipids and cholesterol levels by exercising and minimizing your intake of trans and saturated fats.  Eat a balanced diet with plenty of fruit and vegetables. Blood tests for lipids and cholesterol should begin at age 33 and be repeated every 5 years minimum.  If your lipid or cholesterol levels are high, you are over 40, or you are at high risk for heart disease, you may need your cholesterol levels checked more frequently.Ongoing high lipid and cholesterol levels should be treated with medicines if diet and exercise are not working.   If you smoke, find out from your health care provider how to quit. If you do not use tobacco, do not start.   Lung cancer screening is recommended for adults aged 41-80 years who are at high risk for developing lung cancer because of a history of smoking. A yearly low-dose CT scan of the lungs is recommended for people who have at least a 30-pack-year history of smoking and are a current smoker or have quit within the past 15 years. A pack year of smoking is smoking an average of 1 pack of cigarettes a day for 1 year (for example: 1 pack a  day for 30 years or 2 packs a day for 15 years). Yearly screening should continue until the smoker has stopped smoking for at least 15 years. Yearly screening should be stopped for people who develop a health problem that would prevent them from having lung cancer treatment.   If you are pregnant, do not drink alcohol. If you are breastfeeding, be very cautious about drinking alcohol. If you are not pregnant and choose to drink alcohol, do not have more than 1 drink per day. One drink is considered to be 12 ounces (355 mL) of beer, 5 ounces (148 mL)  of wine, or 1.5 ounces (44 mL) of liquor.   Avoid use of street drugs. Do not share needles with anyone. Ask for help if you need support or instructions about stopping the use of drugs.   High blood pressure causes heart disease and increases the risk of stroke. Your blood pressure should be checked at least yearly.  Ongoing high blood pressure should be treated with medicines if weight loss and exercise do not work.   If you are 21-37 years old, ask your health care provider if you should take aspirin to prevent strokes.   Diabetes screening involves taking a blood sample to check your fasting blood sugar level. This should be done once every 3 years, after age 98, if you are within normal weight and without risk factors for diabetes. Testing should be considered at a younger age or be carried out more frequently if you are overweight and have at least 1 risk factor for diabetes.   Breast cancer screening is essential preventive care for women. You should practice "breast self-awareness."  This means understanding the normal appearance and feel of your breasts and may include breast self-examination.  Any changes detected, no matter how small, should be reported to a health care provider.  Women in their 48s and 30s should have a clinical breast exam (CBE) by a health care provider as part of a regular health exam every 1 to 3 years.  After age 14, women should have a CBE every year.  Starting at age 57, women should consider having a mammogram (breast X-ray test) every year.  Women who have a family history of breast cancer should talk to their health care provider about genetic screening.  Women at a high risk of breast cancer should talk to their health care providers about having an MRI and a mammogram every year.   -Breast cancer gene (BRCA)-related cancer risk assessment is recommended for women who have family members with BRCA-related cancers. BRCA-related cancers include breast, ovarian,  tubal, and peritoneal cancers. Having family members with these cancers may be associated with an increased risk for harmful changes (mutations) in the breast cancer genes BRCA1 and BRCA2. Results of the assessment will determine the need for genetic counseling and BRCA1 and BRCA2 testing.   The Pap test is a screening test for cervical cancer. A Pap test can show cell changes on the cervix that might become cervical cancer if left untreated. A Pap test is a procedure in which cells are obtained and examined from the lower end of the uterus (cervix).   - Women should have a Pap test starting at age 29.   - Between ages 31 and 9, Pap tests should be repeated every 2 years.   - Beginning at age 34, you should have a Pap test every 3 years as long as the past 3 Pap tests have been normal.   -  Some women have medical problems that increase the chance of getting cervical cancer. Talk to your health care provider about these problems. It is especially important to talk to your health care provider if a new problem develops soon after your last Pap test. In these cases, your health care provider may recommend more frequent screening and Pap tests.   - The above recommendations are the same for women who have or have not gotten the vaccine for human papillomavirus (HPV).   - If you had a hysterectomy for a problem that was not cancer or a condition that could lead to cancer, then you no longer need Pap tests. Even if you no longer need a Pap test, a regular exam is a good idea to make sure no other problems are starting.   - If you are between ages 46 and 31 years, and you have had normal Pap tests going back 10 years, you no longer need Pap tests. Even if you no longer need a Pap test, a regular exam is a good idea to make sure no other problems are starting.   - If you have had past treatment for cervical cancer or a condition that could lead to cancer, you need Pap tests and screening for cancer for  at least 20 years after your treatment.   - If Pap tests have been discontinued, risk factors (such as a new sexual partner) need to be reassessed to determine if screening should be resumed.   - The HPV test is an additional test that may be used for cervical cancer screening. The HPV test looks for the virus that can cause the cell changes on the cervix. The cells collected during the Pap test can be tested for HPV. The HPV test could be used to screen women aged 15 years and older, and should be used in women of any age who have unclear Pap test results. After the age of 66, women should have HPV testing at the same frequency as a Pap test.   Colorectal cancer can be detected and often prevented. Most routine colorectal cancer screening begins at the age of 59 years and continues through age 35 years. However, your health care provider may recommend screening at an earlier age if you have risk factors for colon cancer. On a yearly basis, your health care provider may provide home test kits to check for hidden blood in the stool.  Use of a small camera at the end of a tube, to directly examine the colon (sigmoidoscopy or colonoscopy), can detect the earliest forms of colorectal cancer. Talk to your health care provider about this at age 47, when routine screening begins. Direct exam of the colon should be repeated every 5 -10 years through age 50 years, unless early forms of pre-cancerous polyps or small growths are found.   People who are at an increased risk for hepatitis B should be screened for this virus. You are considered at high risk for hepatitis B if:  -You were born in a country where hepatitis B occurs often. Talk with your health care provider about which countries are considered high risk.  - Your parents were born in a high-risk country and you have not received a shot to protect against hepatitis B (hepatitis B vaccine).  - You have HIV or AIDS.  - You use needles to inject street  drugs.  - You live with, or have sex with, someone who has Hepatitis B.  - You get hemodialysis  treatment.  - You take certain medicines for conditions like cancer, organ transplantation, and autoimmune conditions.   Hepatitis C blood testing is recommended for all people born from [birth year] through 1965 and any individual with known risks for hepatitis C.   Practice safe sex. Use condoms and avoid high-risk sexual practices to reduce the spread of sexually transmitted infections (STIs). STIs include gonorrhea, chlamydia, syphilis, trichomonas, herpes, HPV, and human immunodeficiency virus (HIV). Herpes, HIV, and HPV are viral illnesses that have no cure. They can result in disability, cancer, and death. Sexually active women aged 53 years and younger should be checked for chlamydia. Older women with new or multiple partners should also be tested for chlamydia. Testing for other STIs is recommended if you are sexually active and at increased risk.   Osteoporosis is a disease in which the bones lose minerals and strength with aging. This can result in serious bone fractures or breaks. The risk of osteoporosis can be identified using a bone density scan. Women ages 62 years and over and women at risk for fractures or osteoporosis should discuss screening with their health care providers. Ask your health care provider whether you should take a calcium supplement or vitamin D to There are also several preventive steps women can take to avoid osteoporosis and resulting fractures or to keep osteoporosis from worsening. -->Recommendations include:  Eat a balanced diet high in fruits, vegetables, calcium, and vitamins.  Get enough calcium. The recommended total intake of is 1,200 mg daily; for best absorption, if taking supplements, divide doses into 250-500 mg doses throughout the day. Of the two types of calcium, calcium carbonate is best absorbed when taken with food but calcium citrate can be taken on an  empty stomach.  Get enough vitamin D. N[employer] and the [employer] recommend at least 1,000 IU per day for women age 54 and over who are at risk of vitamin D deficiency. Vitamin D deficiency can be caused by inadequate sun exposure (for example, those who live in [place]).  Avoid alcohol and smoking. Heavy alcohol intake (more than 7 drinks per week) increases the risk of falls and hip fracture and women smokers tend to lose bone more rapidly and have lower bone mass than nonsmokers. Stopping smoking is one of the most important changes women can make to improve their health and decrease risk for disease.  Be physically active every day. Weight-bearing exercise (for example, fast walking, hiking, jogging, and weight training) may strengthen bones or slow the rate of bone loss that comes with aging. Balancing and muscle-strengthening exercises can reduce the risk of falling and fracture.  Consider therapeutic medications. Currently, several types of effective drugs are available. Healthcare providers can recommend the type most appropriate for each woman.  Eliminate environmental factors that may contribute to accidents. Falls cause nearly 90% of all osteoporotic fractures, so reducing this risk is an important bone-health strategy. Measures include ample lighting, removing obstructions to walking, using nonskid rugs on floors, and placing mats and/or grab bars in showers.  Be aware of medication side effects. Some common medicines make bones weaker. These include a type of steroid drug called glucocorticoids used for arthritis and asthma, some antiseizure drugs, certain sleeping pills, treatments for endometriosis, and some cancer drugs. An overactive thyroid gland or using too much thyroid hormone for an underactive thyroid can also be a problem. If you are taking these medicines, talk to your doctor about what you can do to help protect your bones.reduce  the rate of  osteoporosis.    Menopause can be associated with physical symptoms and risks. Hormone replacement therapy is available to decrease symptoms and risks. You should talk to your health care provider about whether hormone replacement therapy is right for you.   Use sunscreen. Apply sunscreen liberally and repeatedly throughout the day. You should seek shade when your shadow is shorter than you. Protect yourself by wearing long sleeves, pants, a wide-brimmed hat, and sunglasses year round, whenever you are outdoors.   Once a month, do a whole body skin exam, using a mirror to look at the skin on your back. Tell your health care provider of new moles, moles that have irregular borders, moles that are larger than a pencil eraser, or moles that have changed in shape or color.   -Stay current with required vaccines (immunizations).   Influenza vaccine. All adults should be immunized every year.  Tetanus, diphtheria, and acellular pertussis (Td, Tdap) vaccine. Pregnant women should receive 1 dose of Tdap vaccine during each pregnancy. The dose should be obtained regardless of the length of time since the last dose. Immunization is preferred during the 27th 36th week of gestation. An adult who has not previously received Tdap or who does not know her vaccine status should receive 1 dose of Tdap. This initial dose should be followed by tetanus and diphtheria toxoids (Td) booster doses every 10 years. Adults with an unknown or incomplete history of completing a 3-dose immunization series with Td-containing vaccines should begin or complete a primary immunization series including a Tdap dose. Adults should receive a Td booster every 10 years.  Varicella vaccine. An adult without evidence of immunity to varicella should receive 2 doses or a second dose if she has previously received 1 dose. Pregnant females who do not have evidence of immunity should receive the first dose after pregnancy. This first dose  should be obtained before leaving the health care facility. The second dose should be obtained 4 8 weeks after the first dose.  Human papillomavirus (HPV) vaccine. Females aged 65 26 years who have not received the vaccine previously should obtain the 3-dose series. The vaccine is not recommended for use in pregnant females. However, pregnancy testing is not needed before receiving a dose. If a female is found to be pregnant after receiving a dose, no treatment is needed. In that case, the remaining doses should be delayed until after the pregnancy. Immunization is recommended for any person with an immunocompromised condition through the age of 58 years if she did not get any or all doses earlier. During the 3-dose series, the second dose should be obtained 4 8 weeks after the first dose. The third dose should be obtained 24 weeks after the first dose and 16 weeks after the second dose.  Zoster vaccine. One dose is recommended for adults aged 68 years or older unless certain conditions are present.  Measles, mumps, and rubella (MMR) vaccine. Adults born before 29 generally are considered immune to measles and mumps. Adults born in [birth year] or later should have 1 or more doses of MMR vaccine unless there is a contraindication to the vaccine or there is laboratory evidence of immunity to each of the three diseases. A routine second dose of MMR vaccine should be obtained at least 28 days after the first dose for students attending postsecondary schools, health care workers, or international travelers. People who received inactivated measles vaccine or an unknown type of measles vaccine during 1963 1967 should receive  2 doses of MMR vaccine. People who received inactivated mumps vaccine or an unknown type of mumps vaccine before 1979 and are at high risk for mumps infection should consider immunization with 2 doses of MMR vaccine. For females of childbearing age, rubella immunity should be determined. If there is  no evidence of immunity, females who are not pregnant should be vaccinated. If there is no evidence of immunity, females who are pregnant should delay immunization until after pregnancy. Unvaccinated health care workers born before 30 who lack laboratory evidence of measles, mumps, or rubella immunity or laboratory confirmation of disease should consider measles and mumps immunization with 2 doses of MMR vaccine or rubella immunization with 1 dose of MMR vaccine.  Pneumococcal 13-valent conjugate (PCV13) vaccine. When indicated, a person who is uncertain of her immunization history and has no record of immunization should receive the PCV13 vaccine. An adult aged 60 years or older who has certain medical conditions and has not been previously immunized should receive 1 dose of PCV13 vaccine. This PCV13 should be followed with a dose of pneumococcal polysaccharide (PPSV23) vaccine. The PPSV23 vaccine dose should be obtained at least 8 weeks after the dose of PCV13 vaccine. An adult aged 71 years or older who has certain medical conditions and previously received 1 or more doses of PPSV23 vaccine should receive 1 dose of PCV13. The PCV13 vaccine dose should be obtained 1 or more years after the last PPSV23 vaccine dose.  Pneumococcal polysaccharide (PPSV23) vaccine. When PCV13 is also indicated, PCV13 should be obtained first. All adults aged 12 years and older should be immunized. An adult younger than age 28 years who has certain medical conditions should be immunized. Any person who resides in a nursing home or long-term care facility should be immunized. An adult smoker should be immunized. People with an immunocompromised condition and certain other conditions should receive both PCV13 and PPSV23 vaccines. People with human immunodeficiency virus (HIV) infection should be immunized as soon as possible after diagnosis. Immunization during chemotherapy or radiation therapy should be avoided. Routine use of  PPSV23 vaccine is not recommended for American Indians, [place] Natives, or people younger than 65 years unless there are medical conditions that require PPSV23 vaccine. When indicated, people who have unknown immunization and have no record of immunization should receive PPSV23 vaccine. One-time revaccination 5 years after the first dose of PPSV23 is recommended for people aged 105 64 years who have chronic kidney failure, nephrotic syndrome, asplenia, or immunocompromised conditions. People who received 1 2 doses of PPSV23 before age 56 years should receive another dose of PPSV23 vaccine at age 70 years or later if at least 5 years have passed since the previous dose. Doses of PPSV23 are not needed for people immunized with PPSV23 at or after age 33 years.  Meningococcal vaccine. Adults with asplenia or persistent complement component deficiencies should receive 2 doses of quadrivalent meningococcal conjugate (MenACWY-D) vaccine. The doses should be obtained at least 2 months apart. Microbiologists working with certain meningococcal bacteria, [employer] recruits, people at risk during an outbreak, and people who travel to or live in countries with a high rate of meningitis should be immunized. A first-year college student up through age 76 years who is living in a residence hall should receive a dose if she did not receive a dose on or after her 16th birthday. Adults who have certain high-risk conditions should receive one or more doses of vaccine.  Hepatitis A vaccine. Adults who wish to be  protected from this disease, have certain high-risk conditions, work with hepatitis A-infected animals, work in hepatitis A research labs, or travel to or work in countries with a high rate of hepatitis A should be immunized. Adults who were previously unvaccinated and who anticipate close contact with an international adoptee during the first 60 days after arrival in the [place] from a country with a high rate of  hepatitis A should be immunized.  Hepatitis B vaccine.  Adults who wish to be protected from this disease, have certain high-risk conditions, may be exposed to blood or other infectious body fluids, are household contacts or sex partners of hepatitis B positive people, are clients or workers in certain care facilities, or travel to or work in countries with a high rate of hepatitis B should be immunized.  Haemophilus influenzae type b (Hib) vaccine. A previously unvaccinated person with asplenia or sickle cell disease or having a scheduled splenectomy should receive 1 dose of Hib vaccine. Regardless of previous immunization, a recipient of a hematopoietic stem cell transplant should receive a 3-dose series 6 12 months after her successful transplant. Hib vaccine is not recommended for adults with HIV infection.  Preventive Services / Frequency Ages 34 to 39years  Blood pressure check.** / Every 1 to 2 years.  Lipid and cholesterol check.** / Every 5 years beginning at age 17.  Clinical breast exam.** / Every 3 years for women in their 15s and 99s.  BRCA-related cancer risk assessment.** / For women who have family members with a BRCA-related cancer (breast, ovarian, tubal, or peritoneal cancers).  Pap test.** / Every 2 years from ages 27 through 50. Every 3 years starting at age 48 through age 40 or 75 with a history of 3 consecutive normal Pap tests.  HPV screening.** / Every 3 years from ages 72 through ages 77 to 67 with a history of 3 consecutive normal Pap tests.  Hepatitis C blood test.** / For any individual with known risks for hepatitis C.  Skin self-exam. / Monthly.  Influenza vaccine. / Every year.  Tetanus, diphtheria, and acellular pertussis (Tdap, Td) vaccine.** / Consult your health care provider. Pregnant women should receive 1 dose of Tdap vaccine during each pregnancy. 1 dose of Td every 10 years.  Varicella vaccine.** / Consult your health care provider. Pregnant  females who do not have evidence of immunity should receive the first dose after pregnancy.  HPV vaccine. / 3 doses over 6 months, if 47 and younger. The vaccine is not recommended for use in pregnant females. However, pregnancy testing is not needed before receiving a dose.  Measles, mumps, rubella (MMR) vaccine.** / You need at least 1 dose of MMR if you were born in 1957 or later. You may also need a 2nd dose. For females of childbearing age, rubella immunity should be determined. If there is no evidence of immunity, females who are not pregnant should be vaccinated. If there is no evidence of immunity, females who are pregnant should delay immunization until after pregnancy.  Pneumococcal 13-valent conjugate (PCV13) vaccine.** / Consult your health care provider.  Pneumococcal polysaccharide (PPSV23) vaccine.** / 1 to 2 doses if you smoke cigarettes or if you have certain conditions.  Meningococcal vaccine.** / 1 dose if you are age 70 to 55 years and a Market researcher living in a residence hall, or have one of several medical conditions, you need to get vaccinated against meningococcal disease. You may also need additional booster doses.  Hepatitis A  vaccine.** / Consult your health care provider.  Hepatitis B vaccine.** / Consult your health care provider.  Haemophilus influenzae type b (Hib) vaccine.** / Consult your health care provider.  Ages 76 to 64years  Blood pressure check.** / Every 1 to 2 years.  Lipid and cholesterol check.** / Every 5 years beginning at age 28 years.  Lung cancer screening. / Every year if you are aged 75 80 years and have a 30-pack-year history of smoking and currently smoke or have quit within the past 15 years. Yearly screening is stopped once you have quit smoking for at least 15 years or develop a health problem that would prevent you from having lung cancer treatment.  Clinical breast exam.** / Every year after age 74  years.  BRCA-related cancer risk assessment.** / For women who have family members with a BRCA-related cancer (breast, ovarian, tubal, or peritoneal cancers).  Mammogram.** / Every year beginning at age 77 years and continuing for as long as you are in good health. Consult with your health care provider.  Pap test.** / Every 3 years starting at age 20 years through age 80 or 11 years with a history of 3 consecutive normal Pap tests.  HPV screening.** / Every 3 years from ages 66 years through ages 39 to 74 years with a history of 3 consecutive normal Pap tests.  Fecal occult blood test (FOBT) of stool. / Every year beginning at age 77 years and continuing until age 35 years. You may not need to do this test if you get a colonoscopy every 10 years.  Flexible sigmoidoscopy or colonoscopy.** / Every 5 years for a flexible sigmoidoscopy or every 10 years for a colonoscopy beginning at age 64 years and continuing until age 30 years.  Hepatitis C blood test.** / For all people born from [birth year] through 1967 and any individual with known risks for hepatitis C.  Skin self-exam. / Monthly.  Influenza vaccine. / Every year.  Tetanus, diphtheria, and acellular pertussis (Tdap/Td) vaccine.** / Consult your health care provider. Pregnant women should receive 1 dose of Tdap vaccine during each pregnancy. 1 dose of Td every 10 years.  Varicella vaccine.** / Consult your health care provider. Pregnant females who do not have evidence of immunity should receive the first dose after pregnancy.  Zoster vaccine.** / 1 dose for adults aged 63 years or older.  Measles, mumps, rubella (MMR) vaccine.** / You need at least 1 dose of MMR if you were born in 1957 or later. You may also need a 2nd dose. For females of childbearing age, rubella immunity should be determined. If there is no evidence of immunity, females who are not pregnant should be vaccinated. If there is no evidence of immunity, females who are  pregnant should delay immunization until after pregnancy.  Pneumococcal 13-valent conjugate (PCV13) vaccine.** / Consult your health care provider.  Pneumococcal polysaccharide (PPSV23) vaccine.** / 1 to 2 doses if you smoke cigarettes or if you have certain conditions.  Meningococcal vaccine.** / Consult your health care provider.  Hepatitis A vaccine.** / Consult your health care provider.  Hepatitis B vaccine.** / Consult your health care provider.  Haemophilus influenzae type b (Hib) vaccine.** / Consult your health care provider.  Ages 40 years and over  Blood pressure check.** / Every 1 to 2 years.  Lipid and cholesterol check.** / Every 5 years beginning at age 71 years.  Lung cancer screening. / Every year if you are aged 22 80 years and have  a 30-pack-year history of smoking and currently smoke or have quit within the past 15 years. Yearly screening is stopped once you have quit smoking for at least 15 years or develop a health problem that would prevent you from having lung cancer treatment.  Clinical breast exam.** / Every year after age 60 years.  BRCA-related cancer risk assessment.** / For women who have family members with a BRCA-related cancer (breast, ovarian, tubal, or peritoneal cancers).  Mammogram.** / Every year beginning at age 47 years and continuing for as long as you are in good health. Consult with your health care provider.  Pap test.** / Every 3 years starting at age 77 years through age 63 or 28 years with 3 consecutive normal Pap tests. Testing can be stopped between 65 and 70 years with 3 consecutive normal Pap tests and no abnormal Pap or HPV tests in the past 10 years.  HPV screening.** / Every 3 years from ages 42 years through ages 78 or 74 years with a history of 3 consecutive normal Pap tests. Testing can be stopped between 65 and 70 years with 3 consecutive normal Pap tests and no abnormal Pap or HPV tests in the past 10 years.  Fecal occult  blood test (FOBT) of stool. / Every year beginning at age 49 years and continuing until age 47 years. You may not need to do this test if you get a colonoscopy every 10 years.  Flexible sigmoidoscopy or colonoscopy.** / Every 5 years for a flexible sigmoidoscopy or every 10 years for a colonoscopy beginning at age 24 years and continuing until age 71 years.  Hepatitis C blood test.** / For all people born from [birth year] through 1967 and any individual with known risks for hepatitis C.  Osteoporosis screening.** / A one-time screening for women ages 36 years and over and women at risk for fractures or osteoporosis.  Skin self-exam. / Monthly.  Influenza vaccine. / Every year.  Tetanus, diphtheria, and acellular pertussis (Tdap/Td) vaccine.** / 1 dose of Td every 10 years.  Varicella vaccine.** / Consult your health care provider.  Zoster vaccine.** / 1 dose for adults aged 2 years or older.  Pneumococcal 13-valent conjugate (PCV13) vaccine.** / Consult your health care provider.  Pneumococcal polysaccharide (PPSV23) vaccine.** / 1 dose for all adults aged 79 years and older.  Meningococcal vaccine.** / Consult your health care provider.  Hepatitis A vaccine.** / Consult your health care provider.  Hepatitis B vaccine.** / Consult your health care provider.  Haemophilus influenzae type b (Hib) vaccine.** / Consult your health care provider. ** Family history and personal history of risk and conditions may change your health care provider's recommendations. Document Released: 01/20/2002 Document Revised: 09/14/2013  [facility] Patient Information 2014 [place], [place].   EXERCISE AND DIET:  We recommended that you start or continue a regular exercise program for good health. Regular exercise means any activity that makes your heart beat faster and makes you sweat.  We recommend exercising at least 30 minutes per day at least 3 days a week, preferably 5.  We also recommend a diet low in fat  and sugar / carbohydrates.  Inactivity, poor dietary choices and obesity can cause diabetes, heart attack, stroke, and kidney damage, among others.     ALCOHOL AND SMOKING:  Women should limit their alcohol intake to no more than 7 drinks/beers/glasses of wine (combined, not each!) per week. Moderation of alcohol intake to this level decreases your risk of breast cancer and liver damage.  (  And of course, no recreational drugs are part of a healthy lifestyle.)  Also, you should not be smoking at all or even being exposed to second hand smoke. Most people know smoking can cause cancer, and various heart and lung diseases, but did you know it also contributes to weakening of your bones?  Aging of your skin?  Yellowing of your teeth and nails?   CALCIUM AND VITAMIN D:  Adequate intake of calcium and Vitamin D are recommended.  The recommendations for exact amounts of these supplements seem to change often, but generally speaking 600 mg of calcium (either carbonate or citrate) and 800 units of Vitamin D per day seems prudent. Certain women may benefit from higher intake of Vitamin D.  If you are among these women, your doctor will have told you during your visit.     PAP SMEARS:  Pap smears, to check for cervical cancer or precancers,  have traditionally been done yearly, although recent scientific advances have shown that most women can have pap smears less often.  However, every woman still should have a physical exam from her gynecologist or primary care physician every year. It will include a breast check, inspection of the vulva and vagina to check for abnormal growths or skin changes, a visual exam of the cervix, and then an exam to evaluate the size and shape of the uterus and ovaries.  And after 39 years of age, a rectal exam is indicated to check for rectal cancers. We will also provide age appropriate advice regarding health maintenance, like when you should have certain vaccines, screening for  sexually transmitted diseases, bone density testing, colonoscopy, mammograms, etc.    MAMMOGRAMS:  All women over 103 years old should have a yearly mammogram. Many facilities now offer a "3D" mammogram, which may cost around $50 extra out of pocket. If possible,  we recommend you accept the option to have the 3D mammogram performed.  It both reduces the number of women who will be called back for extra views which then turn out to be normal, and it is better than the routine mammogram at detecting truly abnormal areas.     COLONOSCOPY:  Colonoscopy to screen for colon cancer is recommended for all women at age 76.  We know, you hate the idea of the prep.  We agree, BUT, having colon cancer and not knowing it is worse!!  Colon cancer so often starts as a polyp that can be seen and removed at colonscopy, which can quite literally save your life!  And if your first colonoscopy is normal and you have no family history of colon cancer, most women don't have to have it again for 10 years.  Once every ten years, you can do something that may end up saving your life, right?  We will be happy to help you get it scheduled when you are ready.  Be sure to check your insurance coverage so you understand how much it will cost.  It may be covered as a preventative service at no cost, but you should check your particular policy.

## 2018-11-01 NOTE — Progress Notes (Signed)
Impression and Recommendations:    1. Encounter for wellness examination   2. Obesity, Class I, BMI 30-34.9   3. Health education/counseling   4. Family history of breast cancer in first degree relative   5. Vision test   6. Breast cancer screening   7. Need for Tdap vaccination   8. Flu vaccine need    - Patient will only return to discuss labs if there is something abnormal other than Vitamin D Deficiency.  1) Anticipatory Guidance: Discussed importance of wearing a seatbelt while driving, not texting while driving; sunscreen when outside along with yearly skin surveillance; eating a well balanced and modest diet; physical activity at least 25 minutes per day or 150 min/ week of moderate to intense activity.  - Encouraged patient to use sunscreen.  - Reviewed critical importance of keeping up with exercise, especially given family history of early CAD.  2) Immunizations / Screenings / Labs:  All immunizations and screenings that patient agrees to, are up-to-date per recommendations or will be updated today.  Patient understands the needs for q 51modental and yearly vision screens which pt will schedule independently. Obtain CBC, CMP, HgA1c, Lipid panel, TSH and vit D when fasting if not already done recently.   - Continue to obtain mammograms yearly as recommended due to family history in first-degree relative (mother).  Advised patient to continue to seek specialty care as recommended.  - Need for Tetanus vaccine.  - Need for flu vaccine today.  - Reviewed the importance of monitoring skin changes and discoloration with patient at length today.  Per patient, she followed up with hematology.  Recommended today that the patient follow up with dermatology.   Patient will let [place] if she needs referral to dermatology.  3) Weight: Discussed goal of losing even 5-10% of current body weight which would improve overall feelings of well being and improve objective health data  significantly.   Improve nutrient density of diet through increasing intake of fruits and vegetables and decreasing saturated/trans fats, white flour products and refined sugar products.   4) BMI Counseling: Explained to patient what BMI refers to, and what it means medically.    Told patient to think about it as a "medical risk stratification measurement" and how increasing BMI is associated with increasing risk/ or worsening state of various diseases such as hypertension, hyperlipidemia, diabetes, premature OA, depression etc.  American Heart Association guidelines for healthy diet, basically Mediterranean diet, and exercise guidelines of 30 minutes 5 days per week or more discussed in detail.  Health counseling performed.  All questions answered.  5) Lifestyle & Preventative Health Maintenance - Advised patient to continue working toward exercising to improve overall mental, physical, and emotional health.    - Reviewed the "spokes of the wheel" of mood and health management.  Stressed the importance of ongoing prudent habits, including regular exercise, appropriate sleep hygiene, healthful dietary habits, and prayer/meditation to relax.  - Encouraged patient to engage in daily physical activity, especially a formal exercise routine.  Recommended that the patient eventually strive for at least 150 minutes of moderate cardiovascular activity per week according to guidelines established by the [facility]   - Healthy dietary habits encouraged, including low-carb, and high amounts of lean protein in diet.   - Patient should also consume adequate amounts of water.  6) Follow-Up - Prescriptions refilled today PRN. - Patient had blood work drawn today. - Advised patient to come in and discuss lab  work in near future. - Otherwise, continue to return for CPE and chronic follow-up as scheduled.   - Patient knows to call in sooner if desired to address acute concerns or other health goals, such as weight  loss.    Orders Placed This Encounter  Procedures  . MM Digital Screening  . Flu Vaccine QUAD 6+ mos PF IM (Fluarix Quad PF)  . Tdap vaccine greater than or equal to 7yo IM  . Visual acuity screening    Gross side effects, risk and benefits, and alternatives of medications discussed with patient.  Patient is aware that all medications have potential side effects and we are unable to predict every side effect or drug-drug interaction that may occur.  Expresses verbal understanding and consents to current therapy plan and treatment regimen.  F-up preventative CPE in 1 year. F/up sooner for chronic care management as discussed and/or prn.  Please see orders placed and AVS handed out to patient at the end of our visit for further patient instructions/ counseling done pertaining to today's office visit.   This document serves as a record of services personally performed by Mellody Dance, DO. It was created on her behalf by Toni Amend, a trained medical scribe. The creation of this record is based on the scribe's personal observations and the provider's statements to them.   I have reviewed the above medical documentation for accuracy and completeness and I concur.  Mellody Dance, DO 11/01/2018 9:27 PM        Subjective:    Chief Complaint  Patient presents with  . Annual Exam   CC:   HPI: Jamie Meza is a 37 y.o. female who presents to [place] [facility] today a yearly health maintenance exam.  Health Maintenance Summary Reviewed and updated, unless pt declines services.  Tobacco History Reviewed:   Y; never smoker. Alcohol:   No concerns, no excessive use. Exercise Habits:   Not meeting AHA guidelines.  Was exercising in august and September.  Notes "I'll do good for a while, lose weight, and the gain it again.  States "I've been fluctuating for years."  "Each time it gets harder." STD concerns:   None. Drug Use:   None. Birth control  method:  Tubal ligation. Menses regular:   N/A. Lumps or breast concerns:   No. Breast Cancer Family History:  Yes; obtain mammograms early due to family history in first degree relative.  Her mom and two aunts had breast cancer.  Patient follows up at [facility] with Dr. Garwin Brothers.  OBGYN Follow-Up [facility] with Dr. Garwin Brothers. Next annual is scheduled for January 7th, 2020. Patient obtains mammograms yearly due to family history.  Last pap smear was November of 2018. States that she returns for pap smears yearly.  GI Health Denies family history of colon cancer.  Denies GI distress, diarrhea, constipation, issues with food intake.  Visual Health Patient does not have an eye doctor that she visits regularly.  Dermatological Health Patient states she does not wear sunscreen.  Denies any new spots, moles, anything growing large or changing.  Patient did follow up with hematology regarding the discoloration on her thigh.  Comments that it's never itched, doesn't burn, it's not dry; notes her left side is her dominant side.  She may go to dermatology in the future for follow-up, but is glad to have had some things ruled out by hematology.  She cannot remember injuring the area of discoloration or causing trauma  to the area..  History of Weight Loss I've done it before, but each time, it just seems like it gets harder.   Immunization History  Administered Date(s) Administered  . Influenza,inj,Quad PF,6+ Mos 08/23/2016, 11/01/2018  . Td 12/08/2005  . Tdap 11/01/2018    Health Maintenance  Topic Date Due  . PAP SMEAR  11/01/2018 (Originally 12/09/2015)  . INFLUENZA VACCINE  09/08/2019 (Originally 07/08/2018)  . TETANUS/TDAP  09/17/2019 (Originally 12/09/2015)  . HIV Screening  09/17/2019 (Originally 03/23/1994)     Wt Readings from Last 3 Encounters:  11/01/18 200 lb (90.7 kg)  10/04/18 198 lb 14.4 oz (90.2 kg)  09/28/18 198 lb 3.2 oz (89.9 kg)   BP Readings from Last  3 Encounters:  11/01/18 115/80  10/04/18 113/63  09/28/18 110/74   Pulse Readings from Last 3 Encounters:  11/01/18 83  10/04/18 70  09/28/18 73     Past Medical History:  Diagnosis Date  . Recurrent UTI    occur yearly      Past Surgical History:  Procedure Laterality Date  . IR GENERIC HISTORICAL  08/22/2016   IR ANGIOGRAM SELECTIVE EACH ADDITIONAL VESSEL 08/22/2016 Greggory Keen, MD [facility]  . IR GENERIC HISTORICAL  08/22/2016   IR ANGIOGRAM SELECTIVE EACH ADDITIONAL VESSEL 08/22/2016 Greggory Keen, MD [facility]  . IR GENERIC HISTORICAL  08/22/2016   IR ANGIOGRAM PELVIS SELECTIVE OR SUPRASELECTIVE 08/22/2016 Greggory Keen, MD [facility]  . IR GENERIC HISTORICAL  08/22/2016   IR US GUIDE VASC ACCESS RIGHT 08/22/2016 Greggory Keen, MD [facility]  . IR GENERIC HISTORICAL  08/22/2016   IR ANGIOGRAM PELVIS SELECTIVE OR SUPRASELECTIVE 08/22/2016 Greggory Keen, MD [facility]  . IR GENERIC HISTORICAL  08/22/2016   IR EMBO ARTERIAL NOT HEMORR HEMANG INC GUIDE ROADMAPPING 08/22/2016 Greggory Keen, MD [facility]  . IR GENERIC HISTORICAL  06/17/2016   IR RADIOLOGIST EVAL & MGMT 06/17/2016 Greggory Keen, MD [facility]  . IR GENERIC HISTORICAL  09/10/2016   IR RADIOLOGIST EVAL & MGMT 09/10/2016 [facility]  . TUBAL LIGATION  2006      Family History  Problem Relation Age of Onset  . Breast cancer Mother 7  . Breast cancer Maternal Aunt 34  . Prostate cancer Maternal Uncle   . Stroke Maternal Grandmother   . Diabetes Maternal Grandmother   . Heart attack Father   . Breast cancer Maternal Aunt 45       BRCA negative  . Prostate cancer Maternal Uncle       Social History   Substance and Sexual Activity  Drug Use No  ,   Social History   Substance and Sexual Activity  Alcohol Use No  ,   Social History   Tobacco Use  Smoking Status Never Smoker  Smokeless Tobacco Never Used  ,   Social History   Substance and Sexual Activity  Sexual  Activity Not on file    Current Outpatient Medications on File Prior to Visit  Medication Sig Dispense Refill  . ibuprofen (ADVIL) 200 MG tablet Take 4 tablets (800 mg total) by mouth every 8 (eight) hours as needed for mild pain, moderate pain or cramping. 30 tablet 0  . Loratadine-Pseudoephedrine (CLARITIN-D 12 HOUR PO) Take 1 tablet by mouth daily as needed (allergies/congestion).     No current facility-administered medications on file prior to visit.     Allergies: Patient has no known allergies.  Review of Systems: General:   Denies fever, chills, unexplained weight  loss.  Optho/Auditory:   Denies visual changes, blurred vision/LOV Respiratory:   Denies SOB, DOE more than baseline levels.  Cardiovascular:   Denies chest pain, palpitations, new onset peripheral edema  Gastrointestinal:   Denies nausea, vomiting, diarrhea.  Genitourinary: Denies dysuria, freq/ urgency, flank pain or discharge from genitals.  Endocrine:     Denies hot or cold intolerance, polyuria, polydipsia. Musculoskeletal:   Denies unexplained myalgias, joint swelling, unexplained arthralgias, gait problems.  Skin:  Denies rash, suspicious lesions Neurological:     Denies dizziness, unexplained weakness, numbness  Psychiatric/Behavioral:   Denies mood changes, suicidal or homicidal ideations, hallucinations    Objective:    Blood pressure 115/80, pulse 83, temperature 98 F (36.7 C), height 5' 5.5" (1.664 m), weight 200 lb (90.7 kg), last menstrual period 10/11/2018, SpO2 100 %. Body mass index is 32.78 kg/m. General Appearance:    Alert, cooperative, no distress, appears stated age  Head:    Normocephalic, without obvious abnormality, atraumatic  Eyes:    PERRL, conjunctiva/corneas clear, EOM's intact, fundi    benign, both eyes  Ears:    Normal TM's and external ear canals, both ears  Nose:   Nares normal, septum midline, mucosa normal, no drainage    or sinus tenderness  Throat:   Lips w/o lesion,  mucosa moist, and tongue normal; teeth and   gums normal  Neck:   Supple, symmetrical, trachea midline, no adenopathy;    thyroid:  no enlargement/tenderness/nodules; no carotid   bruit or JVD  Back:     Symmetric, no curvature, ROM normal, no CVA tenderness  Lungs:     Clear to auscultation bilaterally, respirations unlabored, no       Wh/ R/ R  Chest Wall:    No tenderness or gross deformity; normal excursion   Heart:    Regular rate and rhythm, S1 and S2 normal, no murmur, rub   or gallop  Breast Exam:   Deferred to OBGYN.  Abdomen:     Soft, non-tender, bowel sounds active all four quadrants, NO   G/R/R, no masses, no organomegaly  Genitalia:   Deferred to OBGYN.  Rectal:   Deferred to OBGYN.  Extremities:   Extremities normal, atraumatic, no cyanosis or gross edema  Pulses:   2+ and symmetric all extremities  Skin:   On back of left upper thigh, posterior aspect, several ecchymotic-appearing skin lesions with irregular borders, almost appears such as a bruise.  Non-confluent.  Warm, dry, Skin color, texture, turgor normal, no obvious rashes. Psych: No HI/SI, judgement and insight good, Euthymic mood. Full Affect.  Neurologic:   CNII-XII intact, normal strength, sensation and reflexes    Throughout

## 2018-11-02 LAB — CBC WITH DIFFERENTIAL/PLATELET
Basophils Absolute: 0 10*3/uL (ref 0.0–0.2)
Basos: 1 %
EOS (ABSOLUTE): 0.2 10*3/uL (ref 0.0–0.4)
EOS: 2 %
HEMATOCRIT: 33.2 % — AB (ref 34.0–46.6)
HEMOGLOBIN: 11.4 g/dL (ref 11.1–15.9)
IMMATURE GRANS (ABS): 0 10*3/uL (ref 0.0–0.1)
Immature Granulocytes: 0 %
LYMPHS ABS: 2.6 10*3/uL (ref 0.7–3.1)
LYMPHS: 31 %
MCH: 29.9 pg (ref 26.6–33.0)
MCHC: 34.3 g/dL (ref 31.5–35.7)
MCV: 87 fL (ref 79–97)
MONOCYTES: 6 %
Monocytes Absolute: 0.5 10*3/uL (ref 0.1–0.9)
NEUTROS ABS: 5.2 10*3/uL (ref 1.4–7.0)
Neutrophils: 60 %
Platelets: 289 10*3/uL (ref 150–450)
RBC: 3.81 x10E6/uL (ref 3.77–5.28)
RDW: 12.8 % (ref 12.3–15.4)
WBC: 8.5 10*3/uL (ref 3.4–10.8)

## 2018-11-02 LAB — COMPREHENSIVE METABOLIC PANEL
A/G RATIO: 1.8 (ref 1.2–2.2)
ALT: 14 IU/L (ref 0–32)
AST: 18 IU/L (ref 0–40)
Albumin: 4.6 g/dL (ref 3.5–5.5)
Alkaline Phosphatase: 63 IU/L (ref 39–117)
BILIRUBIN TOTAL: 0.4 mg/dL (ref 0.0–1.2)
BUN / CREAT RATIO: 11 (ref 9–23)
BUN: 9 mg/dL (ref 6–20)
CALCIUM: 9.4 mg/dL (ref 8.7–10.2)
CHLORIDE: 104 mmol/L (ref 96–106)
CO2: 21 mmol/L (ref 20–29)
Creatinine, Ser: 0.82 mg/dL (ref 0.57–1.00)
GFR, EST AFRICAN AMERICAN: 104 mL/min/{1.73_m2} (ref >59)
GFR, EST NON AFRICAN AMERICAN: 90 mL/min/{1.73_m2} (ref >59)
GLOBULIN, TOTAL: 2.5 g/dL (ref 1.5–4.5)
Glucose: 80 mg/dL (ref 65–99)
POTASSIUM: 4.3 mmol/L (ref 3.5–5.2)
SODIUM: 139 mmol/L (ref 134–144)
TOTAL PROTEIN: 7.1 g/dL (ref 6.0–8.5)

## 2018-11-02 LAB — VITAMIN D 25 HYDROXY (VIT D DEFICIENCY, FRACTURES): VIT D 25 HYDROXY: 10.2 ng/mL — AB (ref 30.0–100.0)

## 2018-11-02 LAB — LIPID PANEL
CHOLESTEROL TOTAL: 175 mg/dL (ref 100–199)
Chol/HDL Ratio: 2.8 ratio (ref 0.0–4.4)
HDL: 63 mg/dL (ref >39)
LDL Calculated: 100 mg/dL — ABNORMAL HIGH (ref 0–99)
TRIGLYCERIDES: 58 mg/dL (ref 0–149)
VLDL Cholesterol Cal: 12 mg/dL (ref 5–40)

## 2018-11-02 LAB — HEMOGLOBIN A1C
Est. average glucose Bld gHb Est-mCnc: 111 mg/dL
Hgb A1c MFr Bld: 5.5 % (ref 4.8–5.6)

## 2018-11-02 LAB — T4, FREE: Free T4: 1.06 ng/dL (ref 0.82–1.77)

## 2018-11-02 LAB — TSH: TSH: 2.75 u[IU]/mL (ref 0.450–4.500)

## 2018-11-03 ENCOUNTER — Other Ambulatory Visit: Payer: Self-pay

## 2018-11-03 MED ORDER — VITAMIN D (ERGOCALCIFEROL) 1.25 MG (50000 UNIT) PO CAPS: 50000.0000 [IU] | ORAL_CAPSULE | ORAL | 3 refills | Status: DC

## 2018-11-15 ENCOUNTER — Ambulatory Visit (INDEPENDENT_AMBULATORY_CARE_PROVIDER_SITE_OTHER): Payer: 59 | Admitting: Family Medicine

## 2018-11-15 ENCOUNTER — Encounter: Payer: Self-pay | Admitting: Family Medicine

## 2018-11-15 VITALS — BP 110/78 | HR 64 | Temp 97.7°F | Ht 65.5 in | Wt 207.0 lb

## 2018-11-15 DIAGNOSIS — R350 Frequency of micturition: Secondary | ICD-10-CM

## 2018-11-15 DIAGNOSIS — R319 Hematuria, unspecified: Secondary | ICD-10-CM | POA: Diagnosis not present

## 2018-11-15 DIAGNOSIS — N3941 Urge incontinence: Secondary | ICD-10-CM | POA: Diagnosis not present

## 2018-11-15 DIAGNOSIS — R300 Dysuria: Secondary | ICD-10-CM

## 2018-11-15 LAB — POCT URINALYSIS DIPSTICK
Bilirubin, UA: NEGATIVE
Glucose, UA: NEGATIVE
KETONES UA: NEGATIVE
NITRITE UA: NEGATIVE
PROTEIN UA: NEGATIVE
SPEC GRAV UA: 1.01 (ref 1.010–1.025)
Urobilinogen, UA: 0.2 E.U./dL
pH, UA: 6 (ref 5.0–8.0)

## 2018-11-15 MED ORDER — PHENAZOPYRIDINE HCL 200 MG PO TABS: 200.0000 mg | ORAL_TABLET | Freq: Three times a day (TID) | ORAL | 0 refills | Status: AC

## 2018-11-15 MED ORDER — NITROFURANTOIN MONOHYD MACRO 100 MG PO CAPS: 100.0000 mg | ORAL_CAPSULE | Freq: Two times a day (BID) | ORAL | 0 refills | Status: DC

## 2018-11-15 NOTE — Patient Instructions (Signed)

## 2018-11-15 NOTE — Progress Notes (Signed)
      Impression and Recommendations:    1. Frequency of urination   2. Hematuria, unspecified type   3. Urgency incontinence   4. Dysuria     1. Concerns of Potential UTI - Urinalysis drawn today.  - Urine sample sent out for culture.  - AZO/pyridium provided today for relief of discomfort and symptoms.  - Continue drinking adequate water and practicing supportive care.  - Per patient, agrees to begin antibiotics today.  States last time she took antibiotics was in June.  Discussed critical importance of patient finishing all rounds of antibiotics provided.  - Tylenol recommended for assistance with pain relief.   Education and routine counseling performed. Handouts provided.   Orders Placed This Encounter  Procedures  . Urine Culture  . POCT urinalysis dipstick    Meds ordered this encounter  Medications  . nitrofurantoin, macrocrystal-monohydrate, (MACROBID) 100 MG capsule    Sig: Take 1 capsule (100 mg total) by mouth 2 (two) times daily.    Dispense:  10 capsule    Refill:  0  . phenazopyridine (PYRIDIUM) 200 MG tablet    Sig: Take 1 tablet (200 mg total) by mouth 3 (three) times daily for 2 days.    Dispense:  6 tablet    Refill:  0    The patient was counseled, risk factors were discussed, anticipatory guidance given.  Gross side effects, risk and benefits, and alternatives of medications discussed with patient.  Patient is aware that all medications have potential side effects and we are unable to predict every side effect or drug-drug interaction that may occur.  Expresses verbal understanding and consents to current therapy plan and treatment regimen.   Return if symptoms worsen or fail to improve.  This document serves as a record of services personally performed by Deborah Opalski, DO. It was created on her behalf by Katherine Galloway, a trained medical scribe. The creation of this record is based on the scribe's personal observations and the  provider's statements to them.   I have reviewed the above medical documentation for accuracy and completeness and I concur.  Deborah Opalski, DO   Subjective:    HPI: Jamie Meza is a 37 y.o. female who presents to [facility] today for c/o suspected UTI.  Sx for two days.    C/O:  Starting yesterday, last night, she noticed frequency, urgency, and discomfort.  Describes dysuria (pain after urination).  Notes "I can't hold [the pee], that's not an option."    Also noticed blood in her urine this morning.  Denies: Fever; when asked about back pain, she says "not really."   Urinalysis    Component Value Date/Time   BILIRUBINUR negative 11/15/2018 0926   PROTEINUR Negative 11/15/2018 0926   UROBILINOGEN 0.2 11/15/2018 0926   NITRITE negative 11/15/2018 0926   LEUKOCYTESUR Small (1+) (A) 11/15/2018 0926    Wt Readings from Last 3 Encounters:  11/15/18 207 lb (93.9 kg)  11/01/18 200 lb (90.7 kg)  10/04/18 198 lb 14.4 oz (90.2 kg)   BP Readings from Last 3 Encounters:  11/15/18 110/78  11/01/18 115/80  10/04/18 113/63   Pulse Readings from Last 3 Encounters:  11/15/18 64  11/01/18 83  10/04/18 70   BMI Readings from Last 3 Encounters:  11/15/18 33.92 kg/m  11/01/18 32.78 kg/m  10/04/18 33.10 kg/m     Patient Active Problem List   Diagnosis Date Noted  . Bleeding tendency (HCC)   09/28/2018  . Nonblanching rash 09/16/2018  . Spontaneous ecchymosis 09/16/2018  . Obesity, Class I, BMI 30-34.9 09/16/2018  . Family history of breast cancer in first degree relative 09/16/2018  . FHx: early coronary artery disease-father in his late 84s with MI 09/16/2018  . Uterine leiomyoma 08/22/2016  . Fibroids 08/22/2016  . Environmental and seasonal allergies 03/18/2012  . Allergic rhinitis 03/18/2012  . Routine health maintenance 01/04/2012  . UTI (lower urinary tract infection) 09/23/2011  . Anemia 06/26/2011  . Otalgia of right ear  05/23/2011  . Headache(784.0) 05/23/2011  . Headache 05/23/2011    Past Surgical History:  Procedure Laterality Date  . IR GENERIC HISTORICAL  08/22/2016   IR ANGIOGRAM SELECTIVE EACH ADDITIONAL VESSEL 08/22/2016 Greggory Keen, MD [facility]  . IR GENERIC HISTORICAL  08/22/2016   IR ANGIOGRAM SELECTIVE EACH ADDITIONAL VESSEL 08/22/2016 Greggory Keen, MD [facility]  . IR GENERIC HISTORICAL  08/22/2016   IR ANGIOGRAM PELVIS SELECTIVE OR SUPRASELECTIVE 08/22/2016 Greggory Keen, MD [facility]  . IR GENERIC HISTORICAL  08/22/2016   IR US GUIDE VASC ACCESS RIGHT 08/22/2016 Greggory Keen, MD [facility]  . IR GENERIC HISTORICAL  08/22/2016   IR ANGIOGRAM PELVIS SELECTIVE OR SUPRASELECTIVE 08/22/2016 Greggory Keen, MD [facility]  . IR GENERIC HISTORICAL  08/22/2016   IR EMBO ARTERIAL NOT HEMORR HEMANG INC GUIDE ROADMAPPING 08/22/2016 Greggory Keen, MD [facility]  . IR GENERIC HISTORICAL  06/17/2016   IR RADIOLOGIST EVAL & MGMT 06/17/2016 Greggory Keen, MD [facility]  . IR GENERIC HISTORICAL  09/10/2016   IR RADIOLOGIST EVAL & MGMT 09/10/2016 [facility]  . TUBAL LIGATION  2006    Family History  Problem Relation Age of Onset  . Breast cancer Mother 62  . Breast cancer Maternal Aunt 34  . Prostate cancer Maternal Uncle   . Stroke Maternal Grandmother   . Diabetes Maternal Grandmother   . Heart attack Father   . Breast cancer Maternal Aunt 45       BRCA negative  . Prostate cancer Maternal Uncle     Social History   Substance and Sexual Activity  Drug Use No  ,  Social History   Substance and Sexual Activity  Alcohol Use No  ,  Social History   Tobacco Use  Smoking Status Never Smoker  Smokeless Tobacco Never Used  ,  Social History   Substance and Sexual Activity  Sexual Activity Not on file    Patient's Medications  New Prescriptions   NITROFURANTOIN, MACROCRYSTAL-MONOHYDRATE, (MACROBID) 100 MG CAPSULE    Take 1 capsule (100 mg total) by mouth 2  (two) times daily.  Previous Medications   IBUPROFEN (ADVIL) 200 MG TABLET    Take 4 tablets (800 mg total) by mouth every 8 (eight) hours as needed for mild pain, moderate pain or cramping.   LORATADINE-PSEUDOEPHEDRINE (CLARITIN-D 12 HOUR PO)    Take 1 tablet by mouth daily as needed (allergies/congestion).   VITAMIN D, ERGOCALCIFEROL, (DRISDOL) 1.25 MG (50000 UT) CAPS CAPSULE    Take 1 capsule (50,000 Units total) by mouth every 7 (seven) days.  Modified Medications   No medications on file  Discontinued Medications   No medications on file    Patient has no known allergies.  Current Meds  Medication Sig  . ibuprofen (ADVIL) 200 MG tablet Take 4 tablets (800 mg total) by mouth every 8 (eight) hours as needed for mild pain, moderate pain or cramping.  . Loratadine-Pseudoephedrine (CLARITIN-D 12 HOUR PO)  Take 1 tablet by mouth daily as needed (allergies/congestion).  . Vitamin D, Ergocalciferol, (DRISDOL) 1.25 MG (50000 UT) CAPS capsule Take 1 capsule (50,000 Units total) by mouth every 7 (seven) days.    Review of Systems: General:   No F/C, wt loss Pulm:   No DIB, pleuritic chest pain Card:  No CP, palpitations Abd:  No n/v/d or pain GU:  Dysuria, increased frequency and urgency; no vaginal discharge Ext:  No inc edema from baseline   Objective:  Blood pressure 110/78, pulse 64, temperature 97.7 F (36.5 C), height 5' 5.5" (1.664 m), weight 207 lb (93.9 kg), SpO2 100 %. Body mass index is 33.92 kg/m.  General: Well Developed, well nourished, and in no acute distress.  HEENT: Normocephalic, atraumatic Skin: Warm and dry, cap RF less 2 sec, good turgor CV: +S1, S2 Respiratory: ECTA B/L; speaking in full sentences, no conversational dyspnea Abd: Soft, NT, ND, No G/R/R, no SPT, No flank pain NeuroM-Sk: Ambulates w/o assistance, moves * 4 Psych: A and O *3

## 2018-11-17 LAB — URINE CULTURE

## 2018-11-26 ENCOUNTER — Encounter: Payer: Self-pay | Admitting: Family Medicine

## 2018-12-14 ENCOUNTER — Ambulatory Visit
Admission: RE | Admit: 2018-12-14 | Discharge: 2018-12-14 | Disposition: A | Discharge status: Home / Self Care | Payer: 59 | Source: Ambulatory Visit | Attending: Family Medicine | Admitting: Family Medicine

## 2018-12-14 DIAGNOSIS — Z1239 Encounter for other screening for malignant neoplasm of breast: Secondary | ICD-10-CM

## 2018-12-14 DIAGNOSIS — Z803 Family history of malignant neoplasm of breast: Secondary | ICD-10-CM

## 2018-12-16 ENCOUNTER — Other Ambulatory Visit: Payer: Self-pay | Admitting: Obstetrics and Gynecology

## 2018-12-16 DIAGNOSIS — Z803 Family history of malignant neoplasm of breast: Secondary | ICD-10-CM

## 2019-01-03 ENCOUNTER — Ambulatory Visit: Payer: 59 | Admitting: Family Medicine

## 2019-01-10 ENCOUNTER — Ambulatory Visit (INDEPENDENT_AMBULATORY_CARE_PROVIDER_SITE_OTHER): Payer: 59 | Admitting: Family Medicine

## 2019-01-10 ENCOUNTER — Encounter: Payer: Self-pay | Admitting: Family Medicine

## 2019-01-10 VITALS — BP 110/73 | HR 70 | Temp 97.9°F | Ht 65.5 in | Wt 207.0 lb

## 2019-01-10 DIAGNOSIS — F39 Unspecified mood [affective] disorder: Secondary | ICD-10-CM

## 2019-01-10 MED ORDER — BUSPIRONE HCL 10 MG PO TABS: 10.0000 mg | ORAL_TABLET | Freq: Three times a day (TID) | ORAL | 0 refills | Status: DC

## 2019-01-10 MED ORDER — FLUOXETINE HCL 20 MG PO TABS: 20.0000 mg | ORAL_TABLET | Freq: Every day | ORAL | 0 refills | Status: DC

## 2019-01-10 NOTE — Progress Notes (Signed)
Impression and Recommendations:    1. 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 Mood Management - Mixed Depression & Anxiety - Causes of patients symptoms discussed at length today. - Reviewed options for mood management on medication.  - Begin Prozac and Buspirone today. - Risks and benefits of medications extensively discussed.  - Will continue to monitor.  Return in 6-8 weeks. - Patient understands that if she begins to experience red flag psychological thoughts or symptoms, she will dial 911 or reach out to the clinic.  - Patient knows to call if she experiences any concerns.  - Reviewed the "spokes of the wheel" of mood and health management.  Stressed the importance of ongoing prudent practices, including regular exercise, appropriate sleep hygiene, healthful dietary habits, and prayer/meditation to relax.  - Especially encouraged establishing with a counselor or therapist.  - Advised patient to continue working toward exercising to improve overall mental, physical, and emotional health.    - Encouraged patient to engage in daily physical activity, especially a formal exercise routine.  Recommended that the patient eventually strive for at least 150 minutes of moderate cardiovascular activity per week according to guidelines established by the [facility].   - Healthy dietary habits encouraged, including low-carb, and high amounts of lean protein in diet.   - Patient should also consume adequate amounts of water.   Education and routine counseling performed. Handouts provided.    Medications Discontinued During This Encounter  Medication Reason  . nitrofurantoin, macrocrystal-monohydrate, (MACROBID) 100 MG capsule Completed Course     Meds ordered this encounter  Medications  . FLUoxetine (PROZAC) 20 MG tablet    Sig: Take 1 tablet (20 mg total) by mouth daily.    Dispense:  90 tablet    Refill:  0  . busPIRone (BUSPAR) 10 MG tablet    Sig: Take 1  tablet (10 mg total) by mouth 3 (three) times daily.    Dispense:  270 tablet    Refill:  0    Gross side effects, risk and benefits, and alternatives of medications and treatment plan in general discussed with patient.  Patient is aware that all medications have potential side effects and we are unable to predict every side effect or drug-drug interaction that may occur.   Patient will call with any questions prior to using medication if they have concerns.  Expresses verbal understanding and consents to current therapy and treatment regimen.  No barriers to understanding were identified.  Red flag symptoms and signs discussed in detail.  Patient expressed understanding regarding what to do in case of emergency\urgent symptoms  Please see AVS handed out to patient at the end of our visit for further patient instructions/ counseling done pertaining to today's office visit.   Return for f/up 7 wks or so- started new meds- prozac and buspar.     Note:  This document was prepared using Dragon voice recognition software and may include unintentional dictation errors.   This document serves as a record of services personally performed by Mellody Dance, DO. It was created on her behalf by Toni Amend, a trained medical scribe. The creation of this record is based on the scribe's personal observations and the provider's statements to them.   I have reviewed the above medical documentation for accuracy and completeness and I concur.  Mellody Dance, DO 01/10/2019 4:27 PM      -------------------------------------------------------------------------------------------------------------------    Subjective:    CC:  Chief Complaint  Patient presents with  . Weight Loss    HPI: Jamie Meza is a 38 y.o. female who presents to [place] [facility] today for follow-up of mood.   Says she is worried about her weight, but mainly about her psychological  health.  States she has highs and lows.  Has moments where she feels good, feeling "okay," vs moments she feels worse, down, times she could just lay in bed alll day.  Notes she sometimes has a tough time getting out of bed in the morning, but she gets out of bed because she knows she has to work.  Has moments she feels overwhelmed.  "I deal with anxiety a lot."  There are times the anxiety gets really bad, where she feels like she "might melt."  She tries to isolate herself but has noted that these feelings are worsening recently.  Has especially been experiencing more trouble motivating herself.  Different events that will be going on; states "it will take me from now until then to prepare my mind."    "Don't spring stuff on me, I need to know in advance because I have to wrap my mind around certain events, family events and functions."  Admits to experiencing social anxiety.  Has dealt with depression in the past.  States she believes in God and often prays and meditates.  She also likes to read and explores self-help books.  Patient notes that she has a relative that uses Prozac.  Patient is concerned about weight gain as her goal is weight loss.  The thought of hurting herself has crossed her mind, but she has never planned it.  She would never plan it because of her children.  She would not want to leave her children.    Depression screen [facility] 2/9 01/10/2019 11/15/2018 11/01/2018  Decreased Interest 2 0 1  Down, Depressed, Hopeless 2 0 1  PHQ - 2 Score 4 0 2  Altered sleeping 2 0 2  Tired, decreased energy 2 0 1  Change in appetite 2 1 2   Feeling bad or failure about yourself  2 0 0  Trouble concentrating 1 0 1  Moving slowly or fidgety/restless 1 0 0  Suicidal thoughts 1 0 0  PHQ-9 Score 15 1 8   Difficult doing work/chores Very difficult Not difficult at all Not difficult at all     GAD 7 : Generalized Anxiety Score 01/10/2019  Nervous, Anxious, on Edge 2  Control/stop  worrying 2  Worry too much - different things 2  Trouble relaxing 2  Restless 1  Easily annoyed or irritable 3  Afraid - awful might happen 2  Total GAD 7 Score 14  Anxiety Difficulty Very difficult     Wt Readings from Last 3 Encounters:  01/10/19 207 lb (93.9 kg)  11/15/18 207 lb (93.9 kg)  11/01/18 200 lb (90.7 kg)   BP Readings from Last 3 Encounters:  01/10/19 110/73  11/15/18 110/78  11/01/18 115/80   Pulse Readings from Last 3 Encounters:  01/10/19 70  11/15/18 64  11/01/18 83   BMI Readings from Last 3 Encounters:  01/10/19 33.92 kg/m  11/15/18 33.92 kg/m  11/01/18 32.78 kg/m         Patient Care Team    Relationship Specialty Notifications Start End  Mellody Dance, DO PCP - General Family Medicine  09/16/18   Alda Berthold, DO Consulting Physician Neurology  09/16/18   Servando Salina, MD Consulting  Physician Obstetrics and Gynecology  09/16/18      Patient Active Problem List   Diagnosis Date Noted  . 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 Bleeding tendency ([place]) 09/28/2018  . Nonblanching rash 09/16/2018  . Spontaneous ecchymosis 09/16/2018  . Obesity, Class I, BMI 30-34.9 09/16/2018  . Family history of breast cancer in first degree relative 09/16/2018  . FHx: early coronary artery disease-father in his late 38s with MI 09/16/2018  . Uterine leiomyoma 08/22/2016  . Fibroids 08/22/2016  . Environmental and seasonal allergies 03/18/2012  . Allergic rhinitis 03/18/2012  . Routine health maintenance 01/04/2012  . UTI (lower urinary tract infection) 09/23/2011  . Anemia 06/26/2011  . Otalgia of right ear 05/23/2011  . Headache(784.0) 05/23/2011  . Headache 05/23/2011    Past Medical history, Surgical history, Family history, Social history, Allergies and Medications have been entered into the medical record, reviewed and changed as needed.    Current Meds  Medication Sig  . Loratadine-Pseudoephedrine  (CLARITIN-D 12 HOUR PO) Take 1 tablet by mouth daily as needed (allergies/congestion).  . Vitamin D, Ergocalciferol, (DRISDOL) 1.25 MG (50000 UT) CAPS capsule Take 1 capsule (50,000 Units total) by mouth every 7 (seven) days.    Allergies:  No Known Allergies   Review of Systems: Review of Systems: General:   No F/C, wt loss Pulm:   No DIB, SOB, pleuritic chest pain Card:  No CP, palpitations Abd:  No n/v/d or pain Ext:  No inc edema from baseline Psych: no SI/ HI    Objective:   Blood pressure 110/73, pulse 70, temperature 97.9 F (36.6 C), height 5' 5.5" (1.664 m), weight 207 lb (93.9 kg), last menstrual period 12/27/2018, SpO2 100 %. Body mass index is 33.92 kg/m. General:  Well Developed, well nourished, appropriate for stated age.  Neuro:  Alert and oriented,  extra-ocular muscles intact  HEENT:  Normocephalic, atraumatic, neck supple, no carotid bruits appreciated  Skin:  no gross rash, warm, pink. Cardiac:  RRR, S1 S2 Respiratory:  ECTA B/L and A/P, Not using accessory muscles, speaking in full sentences- unlabored. Vascular:  Ext warm, no cyanosis apprec.; cap RF less 2 sec. Psych:  No HI/SI, judgement and insight good, Euthymic mood. Full Affect.

## 2019-01-10 NOTE — Patient Instructions (Addendum)
-  Melissa, Please document GAD and PHQ results in chart- thnx.   Look into the [employer] and free [employer]. Learn how to track your nutrition and gain control of your eating. Look into meditations for wellness and meditations for "mindful eating.  "   And meditation for self control and weight loss   Behavioral Health/ Counseling Referrals    Anderson Malta, personal counselor in [place], specializing in marriage counseling    Dr. Tomi Bamberger, PHD Dr. Tomi Bamberger, PHD is a counselor in [place], [place].  [address] [place], [place] [address] Contact Information [phone]   Kristie Cowman, [place]  [phone] [email or web address] ( she does [place] and faith-based coaching and counseling )    [employer]- ( faith-based counseling ) Address: [address] [place], [place] [address] [phone] [address] for appointments [phone] Fax Hours: Monday - Thursday 8:00am-6:00pm Closed for lunch 12-1Thursday only Friday: Closed all day   Steffanie Rainwater: [phone] or Meg [phone] -counselors in [place] who are faith based   -Also Ms. Marya Fossa - [place] [facility] [place] behavioral [facility]. Darrick Meigs based counseling.    [facility] Nunzio Cobbs, LCSW, ACSW, M.ED.  -Nunzio Cobbs is a licensed clinical social worker in practice over 35 years and with Dr. Chucky May for the last 10 years.  -She sees adults, adolescents, children & families and couples. -Services are provided for mood and anxiety disorders, marital issues, family or parent/child problems, parenting, co-dependency, gender issues, trauma, grief, and stages of life issues. She also provides critical incident stress debriefing.  -Pamala Hurry accepts many employee assistance programs (EAP), [employer], Pharmacist, hospital.  PHONE  [phone]                FAX  [phone]   Rodena [email or web address] [employer]- gen counseling;  PHD   Wilber Oliphant, MSW [address] [place]   [place] Apolonio Schneiders, PhD [address] [facility] [phone]   [place] [facility] Psychological- children [address] [place], [place]    [facility]- various counselors [place], [place] [phone] ( one patient saw Gayland Curry and really liked her)    Lanelle Bal Professional Counselor Counseling and [employer] [phone]   [place] abuse [facility]-Clinical Manager [address] [place] [phone] [place] [address] [place] Delmer Islam, PhD **   -adult, adolescent, and child Oneida Arenas, PhD Leitha Bleak, LCSW Jillene Bucks, PhD-child, adolescent and adults   [facility] [address], [place] [phone] Merilyn Baba, MS-child, adolescent and adults Lennart Pall, PhD-adolescent and adults   [facility]-grief, terminal illness [place], [place] [address], [place] [facility] [address], [place], [place]   [facility] [address], [place], [place] [phone]   [facility] of the [facility] [address], [place] [phone]   [facility] [address], [place]   [facility] [address], [place] [address], [place]

## 2019-01-11 ENCOUNTER — Ambulatory Visit
Admission: RE | Admit: 2019-01-11 | Discharge: 2019-01-11 | Disposition: A | Discharge status: Home / Self Care | Payer: 59 | Source: Ambulatory Visit | Attending: Obstetrics and Gynecology | Admitting: Obstetrics and Gynecology

## 2019-01-11 DIAGNOSIS — Z803 Family history of malignant neoplasm of breast: Secondary | ICD-10-CM

## 2019-01-11 MED ORDER — GADOBUTROL 1 MMOL/ML IV SOLN
10.0000 mL | Freq: Once | INTRAVENOUS | Status: AC | PRN
  Administered 2019-01-11: 10 mL via INTRAVENOUS

## 2019-01-17 ENCOUNTER — Ambulatory Visit: Payer: 59 | Admitting: Neurology

## 2019-01-17 NOTE — Progress Notes (Deleted)
Follow-up Visit   Date: 01/17/19    Jamie Meza MRN: [id number] DOB: 10/23/1981   Interim History: Jamie Meza is a 38 y.o. left-handed African American female with history of uterine fibroids returning to the clinic for follow-up of left hand and foot paresthesias.  The patient was accompanied to the clinic by self.  History of present illness: Starting around February 2019, she began having numbness and tingling over left palm and left foot.  She also has swelling of the hands and has noticed difficulty putting her rings on.  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  There is no specific pattern to the numbness of her foot.  Exercises can sometimes trigger it. She does not have neck or back pain or radicular symptoms. She was evaluated at [facility] by [name], NP and had NCS/EMG of the left side as well as serology testing for paresthesias which was normal.  She is here for second opinion.  UPDATE 01/17/2018:  ***  Medications:  Current Outpatient Medications on File Prior to Visit  Medication Sig Dispense Refill  . busPIRone (BUSPAR) 10 MG tablet Take 1 tablet (10 mg total) by mouth 3 (three) times daily. 270 tablet 0  . FLUoxetine (PROZAC) 20 MG tablet Take 1 tablet (20 mg total) by mouth daily. 90 tablet 0  . ibuprofen (ADVIL) 200 MG tablet Take 4 tablets (800 mg total) by mouth every 8 (eight) hours as needed for mild pain, moderate pain or cramping. 30 tablet 0  . Loratadine-Pseudoephedrine (CLARITIN-D 12 HOUR PO) Take 1 tablet by mouth daily as needed (allergies/congestion).    . Vitamin D, Ergocalciferol, (DRISDOL) 1.25 MG (50000 UT) CAPS capsule Take 1 capsule (50,000 Units total) by mouth every 7 (seven) days. 12 capsule 3   No current facility-administered medications on file prior to visit.     Allergies: No Known Allergies  Review of Systems:  CONSTITUTIONAL:  No fevers, chills, night sweats, or weight loss.  EYES: No visual changes or eye pain ENT: No hearing changes.  No history of nose bleeds.   RESPIRATORY: No cough, wheezing and shortness of breath.   CARDIOVASCULAR: Negative for chest pain, and palpitations.   GI: Negative for abdominal discomfort, blood in stools or black stools.  No recent change in bowel habits.   GU:  No history of incontinence.   MUSCLOSKELETAL: No history of joint pain or swelling.  No myalgias.   SKIN: Negative for lesions, rash, and itching.   ENDOCRINE: Negative for cold or heat intolerance, polydipsia or goiter.   PSYCH:  *** depression or anxiety symptoms.   NEURO: As Above.   Vital Signs:  LMP 12/27/2018 (Approximate)   General Medical Exam:   General:  Well appearing, comfortable  Eyes/ENT: see cranial nerve examination.   Neck: No masses appreciated.  Full range of motion without tenderness.  No carotid bruits. Respiratory:  Clear to auscultation, good air entry bilaterally.   Cardiac:  Regular rate and rhythm, no murmur.   Ext:  No edema  Neurological Exam: MENTAL STATUS including orientation to time, place, person, recent and remote memory, attention span and concentration, language, and fund of knowledge is normal.  Speech is not dysarthric.  CRANIAL NERVES: No visual field defects.  Pupils equal round and reactive to light.  Normal conjugate, extra-ocular eye movements in all directions of gaze.  No ptosis.  Face is symmetric.  Palate elevates symmetrically.  Tongue is midline.  MOTOR:  Motor strength is 5/5 in all extremities.  No atrophy, fasciculations or abnormal movements.  No pronator drift.  Tone is normal.    MSRs:  Reflexes are 2+/4 throughout.  SENSORY:  Intact to ***.  COORDINATION/GAIT:  Normal finger-to- nose-finger and heel-to-shin.  Intact rapid alternating movements bilaterally.  Gait narrow based and stable.   Data: NCS/EMG of the left side 07/07/2018:  This is a normal study.  At this time there is no electrodiagnostic evidence of a widespread peripheral neuropathy, myopathy, left cervical radiculopathy or left lumbosacral radiculopathy.  Labs 04/2018: ANA neg, ferritin 57, folate 11.6, HbA1c 5.6, SPEP with IFE no M protein, TSH 1.93, vitamin B12 951, vitamin B6 26.9, MMA 98, RPR neg  IMPRESSION/PLAN***: *** 1.  Left hand paresthesias, ?mild carpal tunnel syndrome give occupational risk.  Discussed that symptoms may be too mild to be detected on NCS and she can try wearing a wrist splint.    2.  Left foot numbness, unclear etiology.  NCS/EMG performed at [facility] neurology as well as serology testing was personally viewed and is normal.  Additionally, her exam is normal and reassuring.  I doubt this is S1 radiculopathy given intact S1 reflex and lack of radicular symptoms.  I offered MRI brain to evaluate for demyelinating disease as her symptoms are patchy, although overall suspicion is low especially with normal exam.  She prefers to continue to monitor symptoms and consider imaging only if symptoms get worse.   Return to clinic in 4 months or sooner as needed  PLAN/RECOMMENDATIONS:  ***   The duration of this appointment visit was *** minutes of face-to-face time with the patient.  Greater than 50% of this time was spent in counseling, explanation of diagnosis, planning of further management, and coordination of care.   Thank you for allowing me to participate in patient's care.  If I can answer any additional questions, I would be pleased to do so.    Sincerely,    Sheresa Cullop K. Posey Pronto, DO

## 2019-03-04 ENCOUNTER — Ambulatory Visit (INDEPENDENT_AMBULATORY_CARE_PROVIDER_SITE_OTHER): Payer: 59 | Admitting: Family Medicine

## 2019-03-04 ENCOUNTER — Other Ambulatory Visit: Payer: Self-pay

## 2019-03-04 ENCOUNTER — Encounter: Payer: Self-pay | Admitting: Family Medicine

## 2019-03-04 DIAGNOSIS — F419 Anxiety disorder, unspecified: Secondary | ICD-10-CM | POA: Insufficient documentation

## 2019-03-04 DIAGNOSIS — F329 Major depressive disorder, single episode, unspecified: Secondary | ICD-10-CM

## 2019-03-04 DIAGNOSIS — F39 Unspecified mood [affective] disorder: Secondary | ICD-10-CM | POA: Diagnosis not present

## 2019-03-04 DIAGNOSIS — R4589 Other symptoms and signs involving emotional state: Secondary | ICD-10-CM | POA: Insufficient documentation

## 2019-03-04 DIAGNOSIS — E559 Vitamin D deficiency, unspecified: Secondary | ICD-10-CM | POA: Diagnosis not present

## 2019-03-04 MED ORDER — FLUOXETINE HCL 20 MG PO TABS: 20.0000 mg | ORAL_TABLET | Freq: Every day | ORAL | 1 refills | Status: DC

## 2019-03-04 MED ORDER — BUSPIRONE HCL 10 MG PO TABS: 10.0000 mg | ORAL_TABLET | Freq: Three times a day (TID) | ORAL | 1 refills | Status: DC

## 2019-03-04 NOTE — Progress Notes (Signed)
Virtual Visit via Telephone Note for [employer], D.O- Primary Care Physician at [facility]   I connected with current patient today by telephone and verified that I am speaking with the correct person using two identifiers.   I discussed the limitations, risks, security and privacy concerns of performing an evaluation and management service by telephone and the limited availability of in person appointments during this current national crisis of the Covid-19 pandemic.  My staff members also discussed with the patient that there may be a patient responsible charge related to this service.  The patient expressed understanding and agreed to proceed.     History of Present Illness:  Mood:   - Last office visit on 01/10/2019 we started patient on Prozac as well as buspirone.  She was told to follow-up in 6 to 8 weeks.   Depressed - has improved a lot on prozac. Not lying down all day.  Better energy, more motivated. No SI/HI   Anxiety - tolerating buspar well.  Working well.  Having longer fuse, more calm, less anxious, not as worried.    Sleep - well, but more vivid dreams than usual, remembering them better than usual.      Wt Readings from Last 3 Encounters:  01/10/19 207 lb (93.9 kg)  11/15/18 207 lb (93.9 kg)  11/01/18 200 lb (90.7 kg)   BP Readings from Last 3 Encounters:  01/10/19 110/73  11/15/18 110/78  11/01/18 115/80   Pulse Readings from Last 3 Encounters:  01/10/19 70  11/15/18 64  11/01/18 83   BMI Readings from Last 3 Encounters:  01/10/19 33.92 kg/m  11/15/18 33.92 kg/m  11/01/18 32.78 kg/m     Patient Care Team    Relationship Specialty Notifications Start End  Mellody Dance, DO PCP - General Family Medicine  09/16/18   Alda Berthold, [place] Physician Neurology  09/16/18   Servando Salina, MD Consulting Physician Obstetrics and Gynecology  09/16/18      Patient Active Problem List   Diagnosis Date Noted  . Vitamin D  deficiency 03/04/2019  . Depressed mood 03/04/2019  . Anxiety 03/04/2019  . 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 Bleeding tendency ([place]) 09/28/2018  . Nonblanching rash 09/16/2018  . Spontaneous ecchymosis 09/16/2018  . Obesity, Class I, BMI 30-34.9 09/16/2018  . Family history of breast cancer in first degree relative 09/16/2018  . FHx: early coronary artery disease-father in his late 4s with MI 09/16/2018  . Uterine leiomyoma 08/22/2016  . Fibroids 08/22/2016  . Environmental and seasonal allergies 03/18/2012  . Allergic rhinitis 03/18/2012  . Routine health maintenance 01/04/2012  . UTI (lower urinary tract infection) 09/23/2011  . Anemia 06/26/2011  . Otalgia of right ear 05/23/2011  . Headache(784.0) 05/23/2011  . Headache 05/23/2011     Current Meds  Medication Sig  . busPIRone (BUSPAR) 10 MG tablet Take 1 tablet (10 mg total) by mouth 3 (three) times daily.  Marland Kitchen FLUoxetine (PROZAC) 20 MG tablet Take 1 tablet (20 mg total) by mouth daily.  Marland Kitchen ibuprofen (ADVIL) 200 MG tablet Take 4 tablets (800 mg total) by mouth every 8 (eight) hours as needed for mild pain, moderate pain or cramping.  . Loratadine-Pseudoephedrine (CLARITIN-D 12 HOUR PO) Take 1 tablet by mouth daily as needed (allergies/congestion).  . Vitamin D, Ergocalciferol, (DRISDOL) 1.25 MG (50000 UT) CAPS capsule Take 1 capsule (50,000 Units total) by mouth every 7 (seven) days.  . [DISCONTINUED] busPIRone (BUSPAR)  10 MG tablet Take 1 tablet (10 mg total) by mouth 3 (three) times daily.  . [DISCONTINUED] FLUoxetine (PROZAC) 20 MG tablet Take 1 tablet (20 mg total) by mouth daily.     Allergies:  No Known Allergies   ROS:  See above HPI for pertinent positives and negatives   Objective:   Pt did not take her vitals as instructions General: sounds in no acute distress.  Skin: Pt confirms warm and dry  extremities and pink fingertips Respiratory: speaking in full sentences, no  conversational dyspnea Psych: A and O *3, appears insight good, mood- full      Impression and Recommendations:    1. 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  Dispense: 270 tablet; Refill: 1 - FLUoxetine (PROZAC) 20 MG tablet; Take 1 tablet (20 mg total) by mouth daily.  Dispense: 90 tablet; Refill: 1  2. Anxiety / depressed mood Well-controlled on current regiment -We discussed importance of diet, exercise, meditation or prayer, talking to a counselor doing self-help books, proper sleep hygiene etc. in addition to medication management to control her mood.  3. Vitamin D deficiency Patient is taking her weekly vitamin D.  Has improved with energy levels.  Told patient to continue this and we will recheck vitamin D levels in 4 to 6 months   I discussed the assessment and treatment plan with the patient. The patient was provided an opportunity to ask questions and all were answered. The patient agreed with the plan and demonstrated an understanding of the instructions.   The patient was advised to call back or seek an in-person evaluation if the symptoms worsen or if the condition fails to improve as anticipated.   Meds ordered this encounter  Medications  . busPIRone (BUSPAR) 10 MG tablet    Sig: Take 1 tablet (10 mg total) by mouth 3 (three) times daily.    Dispense:  270 tablet    Refill:  1  . FLUoxetine (PROZAC) 20 MG tablet    Sig: Take 1 tablet (20 mg total) by mouth daily.    Dispense:  90 tablet    Refill:  1    Medications Discontinued During This Encounter  Medication Reason  . busPIRone (BUSPAR) 10 MG tablet Reorder  . FLUoxetine (PROZAC) 20 MG tablet Reorder     Gross side effects, risk and benefits, and alternatives of medications and treatment plan in general discussed with patient.  Patient is aware that all medications have potential side effects and we are  unable to predict every side effect or drug-drug interaction that may occur.   Patient was strongly encouraged to call with any questions or concerns they may have concerns.    Expresses verbal understanding and consents to current therapy and treatment regimen.  No barriers to understanding were identified.  Red flag symptoms and signs discussed in detail.  Patient expressed understanding regarding what to do in case of emergency\urgent symptoms  Please see AVS handed out to patient at the end of our visit for further patient instructions/ counseling done pertaining to today's office visit.  I provided 14 minutes 32 seconds of non-face-to-face time during this encounter.     Mellody Dance, DO

## 2019-04-05 ENCOUNTER — Other Ambulatory Visit: Payer: Self-pay | Admitting: Gastroenterology

## 2019-04-05 DIAGNOSIS — R1032 Left lower quadrant pain: Secondary | ICD-10-CM

## 2019-04-08 ENCOUNTER — Other Ambulatory Visit: Payer: Self-pay | Admitting: Gastroenterology

## 2019-04-14 ENCOUNTER — Ambulatory Visit
Admission: RE | Admit: 2019-04-14 | Discharge: 2019-04-14 | Disposition: A | Discharge status: Home / Self Care | Payer: 59 | Source: Ambulatory Visit | Attending: Gastroenterology | Admitting: Gastroenterology

## 2019-04-14 ENCOUNTER — Other Ambulatory Visit: Payer: Self-pay

## 2019-04-14 DIAGNOSIS — R1032 Left lower quadrant pain: Secondary | ICD-10-CM

## 2019-04-14 MED ORDER — IOPAMIDOL (ISOVUE-300) INJECTION 61%
100.0000 mL | Freq: Once | INTRAVENOUS | Status: AC | PRN
  Administered 2019-04-14: 100 mL via INTRAVENOUS

## 2019-09-06 ENCOUNTER — Ambulatory Visit (INDEPENDENT_AMBULATORY_CARE_PROVIDER_SITE_OTHER): Payer: 59 | Admitting: Family Medicine

## 2019-09-06 ENCOUNTER — Other Ambulatory Visit: Payer: Self-pay

## 2019-09-06 DIAGNOSIS — J309 Allergic rhinitis, unspecified: Secondary | ICD-10-CM | POA: Diagnosis not present

## 2019-09-06 DIAGNOSIS — R059 Cough, unspecified: Secondary | ICD-10-CM

## 2019-09-06 DIAGNOSIS — J3089 Other allergic rhinitis: Secondary | ICD-10-CM

## 2019-09-06 DIAGNOSIS — J0111 Acute recurrent frontal sinusitis: Secondary | ICD-10-CM | POA: Diagnosis not present

## 2019-09-06 DIAGNOSIS — R05 Cough: Secondary | ICD-10-CM

## 2019-09-06 DIAGNOSIS — R0982 Postnasal drip: Secondary | ICD-10-CM

## 2019-09-06 MED ORDER — AZELASTINE HCL 0.1 % NA SOLN: 1.0000 | Freq: Two times a day (BID) | NASAL | 5 refills | Status: DC

## 2019-09-06 MED ORDER — FLUTICASONE PROPIONATE 50 MCG/ACT NA SUSP: NASAL | 5 refills | Status: DC

## 2019-09-06 MED ORDER — PREDNISONE 20 MG PO TABS: ORAL_TABLET | ORAL | 0 refills | Status: DC

## 2019-09-06 NOTE — Progress Notes (Signed)
Telehealth office visit note for Jamie Meza, D.O- at Primary Care at [facility]   I connected with current patient today and verified that I am speaking with the correct person using two identifiers.   . Location of the patient: Home . Location of the provider: Office Only the patient (+/- their family members at pt's discretion) and myself were participating in the encounter    - This visit type was conducted due to national recommendations for restrictions regarding the COVID-19 Pandemic (e.g. social distancing) in an effort to limit this patient's exposure and mitigate transmission in our community.  This format is felt to be most appropriate for this patient at this time.   - The patient did not have access to video technology or had technical difficulties with video requiring transitioning to audio format only. - No physical exam could be performed with this format, beyond that communicated to [name] by the patient/ family members as noted.   - Additionally my office staff/ schedulers discussed with the patient that there may be a monetary charge related to this service, depending on their medical insurance.   The patient expressed understanding, and agreed to proceed.       History of Present Illness:  - Concerns about Cough Notes she has a cough; "that's really it."  States she doesn't have any other symptoms.  Notes "going on about two weeks now, and I don't know what it is."  Says sometimes the cough is worse at night; "a couple of nights where it's gotten a little worse.  During the day it depends; sometimes I can go w/o cough  States "I haven't coughed as much today; this morning was fine, but this afternoon it has picked up."  Notices herself coughing more when she's talking more.  Overall denies cold symptoms; "I haven't noticed congestion or anything, I'm not coughing up anything, it's just a cough."  "I have not had anything to come up."  Still has her sense of  taste and smell.  However- she c/o flair of her Seasonal allergies Says with her allergies, "my ears have been 'off the chain'" and notes her "throat and ears have been itching."  She's had some nasal drainage, "but that's it."  "I've had to sneeze here and there, so I've had some drainage from the front."  Says she's had some headaches, but thought she was "just stressed out."  Laughs and says "maybe it's not stress, maybe it has to do with this."  Denies fevers.  Denies ear pain or one-sided face pain.  Denies eye itch or irritation.  Denies SOB.  Denies chest pain, denies one-sided chest pain.  "It doesn't hurt, it's just nagging."  Says she thought it might be COVID, but "because of the symptoms that I'm not having, I didn't give it a second thought."  She has known allergies this time of year.   Patient is currently working from home.     GAD 7 : Generalized Anxiety Score 01/10/2019  Nervous, Anxious, on Edge 2  Control/stop worrying 2  Worry too much - different things 2  Trouble relaxing 2  Restless 1  Easily annoyed or irritable 3  Afraid - awful might happen 2  Total GAD 7 Score 14  Anxiety Difficulty Very difficult    Depression screen [facility] 2/9 01/10/2019 11/15/2018 11/01/2018 09/16/2018  Decreased Interest 2 0 1 0  Down, Depressed, Hopeless 2 0 1 0  PHQ - 2 Score 4  0 2 0  Altered sleeping 2 0 2 -  Tired, decreased energy 2 0 1 -  Change in appetite 2 1 2  -  Feeling bad or failure about yourself  2 0 0 -  Trouble concentrating 1 0 1 -  Moving slowly or fidgety/restless 1 0 0 -  Suicidal thoughts 1 0 0 -  PHQ-9 Score 15 1 8  -  Difficult doing work/chores Very difficult Not difficult at all Not difficult at all -        Impression and Recommendations:    1. Acute recurrent frontal sinusitis   2. Environmental and seasonal allergies   3. Allergic rhinitis with postnasal drip   4. Cough in adult     - Viral vs Allergic vs Bacterial causes for pt's symptoms  reveiwed.    - Supportive care and various OTC medications discussed in addition to any prescribed.  - Advised patient to obtain COVID testing and quarantine until resulted, then we can presume this is her allergies. Told pt where, how etc to get testing - Advised patient to quarantine self and not be around anyone else until negative test returns.  - Since patient denies fevers and one-sided face pain, discussed that bacterial infection is not suspected / not likely at this time.  Discussed that patient symptoms are more likely due to allergies and post-nasal drip.  - Reviewed indications for oral steroids with patient today, she desires them and work well, tol well. - Oral steroid taper prescribed today.  See med list.  - Nasal sprays discussed and prescribed today.  See med list. - Reviewed use of nasal sprays to assist with allergic symptoms such as sinus itching, post-nasal drip, and runny nose.  Advised the patient to begin using AYR or Neilmed sinus rinses BID followed by nasal sprays BID (one spray to each nostril).  Discussed prudent use of sinus rinses during allergy season of spring and fall, whenever symptoms affect the patient most. - Advised that the patient may also incorporate allegra or claritin PRN.   - To help alleviate sx of post nasal drip at night, advised patient to sleep propped up.  - Call or RTC if new symptoms, especially fevers or one-sided face pain develops, or if experiencing no improvement or worse over next several days.   - Will consider ABX if sx continue past 10 days and worsening if not already given.   Recommendations -  Return around a week after November 25th, 2020, for CPE and follow-up.  - As part of my medical decision making, I reviewed the following data within the [place] History obtained from pt /family, CMA notes reviewed and incorporated if applicable, Labs reviewed, Radiograph/ tests reviewed if applicable and OV notes from  prior OV's with me, as well as other specialists she/he has seen since seeing me last, were all reviewed and used in my medical decision making process today.   - Additionally, discussion had with patient regarding txmnt plan, and their biases/concerns about that plan were used in my medical decision making today.   - The patient agreed with the plan and demonstrated an understanding of the instructions.   No barriers to understanding were identified.   - Red flag symptoms and signs discussed in detail.  Patient expressed understanding regarding what to do in case of emergency\ urgent symptoms.  The patient was advised to call back or seek an in-person evaluation if the symptoms worsen or if the condition fails to improve  as anticipated.   Return for  Return around a week after November 25th, 2020, for CPE and follow-up..    Meds ordered this encounter  Medications  . predniSONE (DELTASONE) 20 MG tablet    Sig: Take 3 tabs po * 2 days, then 2 tabs for 2 d, then 1 tab 2 d, then 1/2 tab 2 days.    Dispense:  15 tablet    Refill:  0  . azelastine (ASTELIN) 0.1 % nasal spray    Sig: Place 1 spray into both nostrils 2 (two) times daily. after sinus rinses    Dispense:  30 mL    Refill:  5  . fluticasone (FLONASE) 50 MCG/ACT nasal spray    Sig: 1 spray each nostril following sinus rinses twice daily    Dispense:  16 g    Refill:  5    I provided 16 minutes of non face-to-face time during this encounter.  Additional time was spent with charting and coordination of care after the actual visit commenced.   Note:  This note was prepared with assistance of Dragon voice recognition software. Occasional wrong-word or sound-a-like substitutions may have occurred due to the inherent limitations of voice recognition software.  This document serves as a record of services personally performed by Jamie Dance, DO. It was created on her behalf by Toni Amend, a trained medical scribe. The  creation of this record is based on the scribe's personal observations and the provider's statements to them.   I have reviewed the above medical documentation for accuracy and completeness and I concur.  Jamie Dance, DO 09/08/2019 11:03 AM        Patient Care Team    Relationship Specialty Notifications Start End  Jamie Dance, DO PCP - General Family Medicine  09/16/18   Alda Berthold, DO Consulting Physician Neurology  09/16/18   Servando Salina, MD Consulting Physician Obstetrics and Gynecology  09/16/18      -Vitals obtained; medications/ allergies reconciled;  personal medical, social, Sx etc.histories were updated by CMA, reviewed by me and are reflected in chart   Patient Active Problem List   Diagnosis Date Noted  . Vitamin D deficiency 03/04/2019  . Depressed mood 03/04/2019  . Anxiety 03/04/2019  . 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 Bleeding tendency ([place]) 09/28/2018  . Nonblanching rash 09/16/2018  . Spontaneous ecchymosis 09/16/2018  . Obesity, Class I, BMI 30-34.9 09/16/2018  . Family history of breast cancer in first degree relative 09/16/2018  . FHx: early coronary artery disease-father in his late 77s with MI 09/16/2018  . Uterine leiomyoma 08/22/2016  . Fibroids 08/22/2016  . Environmental and seasonal allergies 03/18/2012  . Allergic rhinitis 03/18/2012  . Routine health maintenance 01/04/2012  . UTI (lower urinary tract infection) 09/23/2011  . Anemia 06/26/2011  . Otalgia of right ear 05/23/2011  . Headache(784.0) 05/23/2011  . Headache 05/23/2011     No outpatient medications have been marked as taking for the 09/06/19 encounter (Office Visit) with Jamie Dance, DO.     Allergies:  No Known Allergies   ROS:  See above HPI for pertinent positives and negatives   Objective:   There were no vitals taken for this visit.  (if some vitals are omitted, this means that patient was UNABLE to  obtain them even though they were asked to get them prior to OV today.  They were asked to call us at their earliest convenience with these once obtained. )  General: A & O * 3; sounds in no acute distress; in usual state of health.  Skin: Pt confirms warm and dry extremities and pink fingertips HEENT: Pt confirms lips non-cyanotic Chest: Patient confirms normal chest excursion and movement Respiratory: speaking in full sentences, no conversational dyspnea; patient confirms no use of accessory muscles Psych: insight appears good, mood- appears full

## 2019-09-06 NOTE — Progress Notes (Deleted)
Virtual Visit via Telephone Note  I connected with Jamie Meza on 09/06/19 at  4:15 PM EDT by telephone and verified that I am speaking with the correct person using two identifiers.  Location: Patient: Home Provider: In Clinic   I discussed the limitations, risks, security and privacy concerns of performing an evaluation and management service by telephone and the availability of in person appointments. I also discussed with the patient that there may be a patient responsible charge related to this service. The patient expressed understanding and agreed to proceed.   History of Present Illness: Jamie Meza calls in  Meza not see SARS-CoV-2 test in system  Patient Care Team    Relationship Specialty Notifications Start End  Jamie Meza PCP - General Family Medicine  09/16/18   Jamie Meza, [place] Physician Neurology  09/16/18   Jamie Salina, MD Consulting Physician Obstetrics and Gynecology  09/16/18     Patient Active Problem List   Diagnosis Date Noted  . Vitamin D deficiency 03/04/2019  . Depressed mood 03/04/2019  . Anxiety 03/04/2019  . 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 Bleeding tendency ([place]) 09/28/2018  . Nonblanching rash 09/16/2018  . Spontaneous ecchymosis 09/16/2018  . Obesity, Class I, BMI 30-34.9 09/16/2018  . Family history of breast cancer in first degree relative 09/16/2018  . FHx: early coronary artery disease-father in his late 70s with MI 09/16/2018  . Uterine leiomyoma 08/22/2016  . Fibroids 08/22/2016  . Environmental and seasonal allergies 03/18/2012  . Allergic rhinitis 03/18/2012  . Routine health maintenance 01/04/2012  . UTI (lower urinary tract infection) 09/23/2011  . Anemia 06/26/2011  . Otalgia of right ear 05/23/2011  . Headache(784.0) 05/23/2011  . Headache 05/23/2011     Past Medical History:  Diagnosis Date  . Recurrent UTI    occur yearly     Past Surgical History:   Procedure Laterality Date  . IR GENERIC HISTORICAL  08/22/2016   IR ANGIOGRAM SELECTIVE EACH ADDITIONAL VESSEL 08/22/2016 Greggory Keen, MD [facility]  . IR GENERIC HISTORICAL  08/22/2016   IR ANGIOGRAM SELECTIVE EACH ADDITIONAL VESSEL 08/22/2016 Greggory Keen, MD [facility]  . IR GENERIC HISTORICAL  08/22/2016   IR ANGIOGRAM PELVIS SELECTIVE OR SUPRASELECTIVE 08/22/2016 Greggory Keen, MD [facility]  . IR GENERIC HISTORICAL  08/22/2016   IR US GUIDE VASC ACCESS RIGHT 08/22/2016 Greggory Keen, MD [facility]  . IR GENERIC HISTORICAL  08/22/2016   IR ANGIOGRAM PELVIS SELECTIVE OR SUPRASELECTIVE 08/22/2016 Greggory Keen, MD [facility]  . IR GENERIC HISTORICAL  08/22/2016   IR EMBO ARTERIAL NOT HEMORR HEMANG INC GUIDE ROADMAPPING 08/22/2016 Greggory Keen, MD [facility]  . IR GENERIC HISTORICAL  06/17/2016   IR RADIOLOGIST EVAL & MGMT 06/17/2016 Greggory Keen, MD [facility]  . IR GENERIC HISTORICAL  09/10/2016   IR RADIOLOGIST EVAL & MGMT 09/10/2016 [facility]  . TUBAL LIGATION  2006     Family History  Problem Relation Age of Onset  . Breast cancer Mother 2  . Breast cancer Maternal Aunt 34  . Prostate cancer Maternal Uncle   . Stroke Maternal Grandmother   . Diabetes Maternal Grandmother   . Heart attack Father   . Breast cancer Maternal Aunt 45       BRCA negative  . Prostate cancer Maternal Uncle      Social History   Substance and Sexual Activity  Drug Use No     Social History  Substance and Sexual Activity  Alcohol Use No     Social History   Tobacco Use  Smoking Status Never Smoker  Smokeless Tobacco Never Used     Outpatient Encounter Medications as of 09/06/2019  Medication Sig  . busPIRone (BUSPAR) 10 MG tablet Take 1 tablet (10 mg total) by mouth 3 (three) times daily.  Marland Kitchen FLUoxetine (PROZAC) 20 MG tablet Take 1 tablet (20 mg total) by mouth daily.  Marland Kitchen ibuprofen (ADVIL) 200 MG tablet Take 4 tablets (800 mg total) by mouth every 8  (eight) hours as needed for mild pain, moderate pain or cramping.  . Loratadine-Pseudoephedrine (CLARITIN-D 12 HOUR PO) Take 1 tablet by mouth daily as needed (allergies/congestion).  . Vitamin D, Ergocalciferol, (DRISDOL) 1.25 MG (50000 UT) CAPS capsule Take 1 capsule (50,000 Units total) by mouth every 7 (seven) days.   No facility-administered encounter medications on file as of 09/06/2019.     Allergies: Patient has no known allergies.  There is no height or weight on file to calculate BMI.  There were no vitals taken for this visit. Review of Systems: General:   Denies fever, chills, unexplained weight loss.  Optho/Auditory:   Denies visual changes, blurred vision/LOV Respiratory:   Denies SOB, DOE more than baseline levels.  Cardiovascular:   Denies chest pain, palpitations, new onset peripheral edema  Gastrointestinal:   Denies nausea, vomiting, diarrhea.  Genitourinary: Denies dysuria, freq/ urgency, flank pain or discharge from genitals.  Endocrine:     Denies hot or cold intolerance, polyuria, polydipsia. Musculoskeletal:   Denies unexplained myalgias, joint swelling, unexplained arthralgias, gait problems.  Skin:  Denies rash, suspicious lesions Neurological:     Denies dizziness, unexplained weakness, numbness  Psychiatric/Behavioral:   Denies mood changes, suicidal or homicidal ideations, hallucinations      Observations/Objective:   Assessment and Plan:   Follow Up Instructions:    I discussed the assessment and treatment plan with the patient. The patient was provided an opportunity to ask questions and all were answered. The patient agreed with the plan and demonstrated an understanding of the instructions.   The patient was advised to call back or seek an in-person evaluation if the symptoms worsen or if the condition fails to improve as anticipated.  I provided *** minutes of non-face-to-face time during this encounter.   Jamie Grandchild, NP

## 2019-10-10 ENCOUNTER — Ambulatory Visit (INDEPENDENT_AMBULATORY_CARE_PROVIDER_SITE_OTHER): Payer: 59 | Admitting: Family Medicine

## 2019-10-10 ENCOUNTER — Encounter: Payer: Self-pay | Admitting: Family Medicine

## 2019-10-10 ENCOUNTER — Other Ambulatory Visit: Payer: Self-pay

## 2019-10-10 VITALS — Temp 98.0°F | Ht 66.0 in | Wt 176.0 lb

## 2019-10-10 DIAGNOSIS — R05 Cough: Secondary | ICD-10-CM

## 2019-10-10 DIAGNOSIS — J3089 Other allergic rhinitis: Secondary | ICD-10-CM

## 2019-10-10 DIAGNOSIS — R0982 Postnasal drip: Secondary | ICD-10-CM

## 2019-10-10 DIAGNOSIS — Z23 Encounter for immunization: Secondary | ICD-10-CM | POA: Diagnosis not present

## 2019-10-10 DIAGNOSIS — Z9114 Patient's other noncompliance with medication regimen: Secondary | ICD-10-CM

## 2019-10-10 DIAGNOSIS — J309 Allergic rhinitis, unspecified: Secondary | ICD-10-CM

## 2019-10-10 DIAGNOSIS — F419 Anxiety disorder, unspecified: Secondary | ICD-10-CM

## 2019-10-10 DIAGNOSIS — R059 Cough, unspecified: Secondary | ICD-10-CM

## 2019-10-10 MED ORDER — LEVOCETIRIZINE DIHYDROCHLORIDE 5 MG PO TABS: 5.0000 mg | ORAL_TABLET | Freq: Every evening | ORAL | 1 refills | Status: DC

## 2019-10-10 NOTE — Progress Notes (Signed)
Telehealth office visit note for Jamie Meza, D.O- at Primary Care at [facility]   I connected with current patient today and verified that I am speaking with the correct person using two identifiers.   . Location of the patient: Home . Location of the provider: Office Only the patient (+/- their family members at pt's discretion) and myself were participating in the encounter - This visit type was conducted due to national recommendations for restrictions regarding the COVID-19 Pandemic (e.g. social distancing) in an effort to limit this patient's exposure and mitigate transmission in our community.  This format is felt to be most appropriate for this patient at this time.   - The patient did not have access to video technology or had technical difficulties with video requiring transitioning to audio format only. - No physical exam could be performed with this format, beyond that communicated to [name] by the patient/ family members as noted.   - Additionally my office staff/ schedulers discussed with the patient that there may be a monetary charge related to this service, depending on their medical insurance.   The patient expressed understanding, and agreed to proceed.       History of Present Illness: I, Toni Amend, am serving as scribe for Dr. Mellody Meza.  Patient states [facility]  Additionally notes she went for testing last Friday or the Friday before because she was at an event with a person that had COVID.  Clarifies "I was in a location with someone who had tested positive for COVID."  Says she continues having a cough, "it depends on the time of day, it probably is more at night."  Says "I've had a little more phlegm or mucus coming up in the mornings, but I don't know why."  Denies SOB.  Says "it's just this cough."  Says she doesn't normally have an issue with a cough even with her historical troubles  with allergies and sinuses.  Notes she's used Claritin in the past, "but never had a cough with that."  Notes she has been taking Claritin for "maybe two years."  To treat her symptoms, she has been using cough drops; completed the prednisone treatment prescribed by clinic previously.  --- "Other than that, I'm not taking any type of medication; just cough drops."  Says she quit using the Flonase once the prednisone was done because she no longer felt that she was experiencing postnasal drip.  Says after she completed the prednisone prescription, she discontinued use of the nasal sprays.  Thinks her house is about 40 years old; notes that they do have an air purifier there but she does not use the air purifier currently.    GAD 7 : Generalized Anxiety Score 01/10/2019  Nervous, Anxious, on Edge 2  Control/stop worrying 2  Worry too much - different things 2  Trouble relaxing 2  Restless 1  Easily annoyed or irritable 3  Afraid - awful might happen 2  Total GAD 7 Score 14  Anxiety Difficulty Very difficult    Depression screen [facility] 2/9 10/10/2019 01/10/2019 11/15/2018 11/01/2018 09/16/2018  Decreased Interest 0 2 0 1 0  Down, Depressed, Hopeless 0 2 0 1 0  PHQ - 2 Score 0 4 0 2 0  Altered sleeping 0 2 0 2 -  Tired, decreased energy 0 2 0 1 -  Change in appetite 0 2 1 2  -  Feeling bad or failure about yourself  0 2 0 0 -  Trouble concentrating 0 1 0 1 -  Moving slowly or fidgety/restless 0 1 0 0 -  Suicidal thoughts 0 1 0 0 -  PHQ-9 Score 0 15 1 8  -  Difficult doing work/chores - Very difficult Not difficult at all Not difficult at all -      Impression and Recommendations:    1. Need for immunization against influenza   2. Environmental and seasonal allergies   3. Allergic rhinitis with postnasal drip   4. Cough in adult   5. Anxiety   6. Noncompliance w/medication treatment due to intermit use of medication      Environmental & Seasonal Allergies, Allergic Rhinitis w/  Postnasal Drip, Cough - Told patient to send recent results of COVID testing to clinic ASAP. - Discussed that patient may forward results via e-mail as recommended. - Alternatively, advised patient to call the front desk and advise them where tests were performed. - Discussed need for negative COVID test before patient can return to clinic.  Also further explained that a negative Covid test is only good for 7 days so, unless her symptoms have not changed at all from when she last got a negative Covid, she would certainly need a new test if new symptoms.  - Reviewed need to use Flonase and Astelin daily to control sinus symptoms.  - Advised patient to take her nasal sprays as directed after prudent sinus rinsing.  - Discussed that patient may also incorporate Zyrtec, Xyzal, Claritin OTC daily and the like.  Patient knows to stop Claritin since giving her Xyzal. - Prescription Xyzal written today.  See med list.  - Advised patient to minimize exposure to environment triggers if at all possible. - Recommended using air purifier in the house, especially in the bedroom.  - Strongly encouraged patient to attempt treatment for symptoms as advised.  See med list. - After 4-6 weeks on treatment as prescribed, will re-evaluate symptoms for improvement.  - Discussed that if patient fails treatment, she may be referred to an allergist plus forward minus ear nose and throat for chronic cough evaluation    Recommendations - Due for CPE with fasting blood work end of November.   - Additionally, discussion had with patient regarding our treatment plan, and their biases/concerns about that plan were used in my medical decision making today.    - The patient agreed with the plan and demonstrated an understanding of the instructions.   No barriers to understanding were identified.    - Red flag symptoms and signs discussed in detail.  Patient expressed understanding regarding what to do in case of  emergency\ urgent symptoms.   - The patient was advised to call back or seek an in-person evaluation if the symptoms worsen or if the condition fails to improve as anticipated.   Return for Nov 25th, 2020, for CPE.    Orders Placed This Encounter  Procedures  . Flu Vaccine QUAD 6+ mos PF IM (Fluarix Quad PF)    Meds ordered this encounter  Medications  . levocetirizine (XYZAL) 5 MG tablet    Sig: Take 1 tablet (5 mg total) by mouth every evening.    Dispense:  90 tablet    Refill:  1    Medications Discontinued During This Encounter  Medication Reason  . predniSONE (DELTASONE) 20 MG tablet Error  . ibuprofen (ADVIL) 200 MG tablet   . Loratadine-Pseudoephedrine (CLARITIN-D 12 HOUR PO)  I provided 20+ minutes of non face-to-face time during this encounter.  Additional time was spent with charting and coordination of care after the actual visit commenced.   Note:  This note was prepared with assistance of Dragon voice recognition software. Occasional wrong-word or sound-a-like substitutions may have occurred due to the inherent limitations of voice recognition software.  This document serves as a record of services personally performed by Jamie Dance, DO. It was created on her behalf by Toni Amend, a trained medical scribe. The creation of this record is based on the scribe's personal observations and the provider's statements to them.   This case required medical decision making of at least moderate complexity. The above documentation has been reviewed to be accurate and was completed by Marjory Sneddon, D.O.       Patient Care Team    Relationship Specialty Notifications Start End  Jamie Dance, DO PCP - General Family Medicine  09/16/18   Alda Berthold, DO Consulting Physician Neurology  09/16/18   Servando Salina, MD Consulting Physician Obstetrics and Gynecology  09/16/18      -Vitals obtained; medications/ allergies reconciled;   personal medical, social, Sx etc.histories were updated by CMA, reviewed by me and are reflected in chart   Patient Active Problem List   Diagnosis Date Noted  . 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 Vitamin D deficiency 03/04/2019    Priority: Medium  . Environmental and seasonal allergies 03/18/2012    Priority: Low  . Allergic rhinitis 03/18/2012    Priority: Low  . Noncompliance w/medication treatment due to intermit use of medication 10/10/2019  . Depressed mood 03/04/2019  . Anxiety 03/04/2019  . Bleeding tendency ([place]) 09/28/2018  . Nonblanching rash 09/16/2018  . Spontaneous ecchymosis 09/16/2018  . Obesity, Class I, BMI 30-34.9 09/16/2018  . Family history of breast cancer in first degree relative 09/16/2018  . FHx: early coronary artery disease-father in his late 90s with MI 09/16/2018  . Uterine leiomyoma 08/22/2016  . Fibroids 08/22/2016  . Routine health maintenance 01/04/2012  . UTI (lower urinary tract infection) 09/23/2011  . Anemia 06/26/2011  . Otalgia of right ear 05/23/2011  . Headache(784.0) 05/23/2011  . Headache 05/23/2011     Current Meds  Medication Sig  . azelastine (ASTELIN) 0.1 % nasal spray Place 1 spray into both nostrils 2 (two) times daily. after sinus rinses  . busPIRone (BUSPAR) 10 MG tablet Take 1 tablet (10 mg total) by mouth 3 (three) times daily.  Marland Kitchen FLUoxetine (PROZAC) 20 MG tablet Take 1 tablet (20 mg total) by mouth daily.  . fluticasone (FLONASE) 50 MCG/ACT nasal spray 1 spray each nostril following sinus rinses twice daily  . Vitamin D, Ergocalciferol, (DRISDOL) 1.25 MG (50000 UT) CAPS capsule Take 1 capsule (50,000 Units total) by mouth every 7 (seven) days.  . [DISCONTINUED] ibuprofen (ADVIL) 200 MG tablet Take 4 tablets (800 mg total) by mouth every 8 (eight) hours as needed for mild pain, moderate pain or cramping.  . [DISCONTINUED] Loratadine-Pseudoephedrine (CLARITIN-D 12 HOUR PO) Take  1 tablet by mouth daily as needed (allergies/congestion).     Allergies:  No Known Allergies   ROS:  See above HPI for pertinent positives and negatives   Objective:   Temperature 98 F (36.7 C), temperature source Oral, height 5\' 6"  (1.676 m), weight 176 lb (79.8 kg), last menstrual period 09/26/2019.  (if some vitals are omitted, this means that patient was UNABLE  to obtain them even though they were asked to get them prior to OV today.  They were asked to call us at their earliest convenience with these once obtained. )  General: A & O * 3; sounds in no acute distress; in usual state of health.  Skin: Pt confirms warm and dry extremities and pink fingertips HEENT: Pt confirms lips non-cyanotic Chest: Patient confirms normal chest excursion and movement Respiratory: speaking in full sentences, no conversational dyspnea; patient confirms no use of accessory muscles Psych: insight appears good, mood- appears full

## 2020-02-08 LAB — HM MAMMOGRAPHY

## 2020-05-11 ENCOUNTER — Other Ambulatory Visit: Payer: Self-pay | Admitting: Obstetrics and Gynecology

## 2020-05-11 DIAGNOSIS — N644 Mastodynia: Secondary | ICD-10-CM

## 2020-05-25 ENCOUNTER — Ambulatory Visit
Admission: RE | Admit: 2020-05-25 | Discharge: 2020-05-25 | Disposition: A | Discharge status: Home / Self Care | Payer: 59 | Source: Ambulatory Visit | Attending: Obstetrics and Gynecology | Admitting: Obstetrics and Gynecology

## 2020-05-25 ENCOUNTER — Other Ambulatory Visit: Payer: Self-pay

## 2020-05-25 DIAGNOSIS — N644 Mastodynia: Secondary | ICD-10-CM

## 2020-06-18 ENCOUNTER — Ambulatory Visit: Payer: 59 | Attending: Internal Medicine

## 2020-06-18 DIAGNOSIS — Z20822 Contact with and (suspected) exposure to covid-19: Secondary | ICD-10-CM

## 2020-06-19 LAB — SARS-COV-2, NAA 2 DAY TAT

## 2020-06-19 LAB — NOVEL CORONAVIRUS, NAA: SARS-CoV-2, NAA: NOT DETECTED

## 2020-12-03 ENCOUNTER — Other Ambulatory Visit: Payer: 59

## 2020-12-03 DIAGNOSIS — Z20822 Contact with and (suspected) exposure to covid-19: Secondary | ICD-10-CM

## 2020-12-05 LAB — SARS-COV-2, NAA 2 DAY TAT

## 2020-12-05 LAB — NOVEL CORONAVIRUS, NAA: SARS-CoV-2, NAA: DETECTED — AB

## 2020-12-27 ENCOUNTER — Ambulatory Visit: Payer: 59 | Admitting: Physician Assistant

## 2021-01-08 ENCOUNTER — Other Ambulatory Visit: Payer: Self-pay | Admitting: General Surgery

## 2021-01-09 ENCOUNTER — Other Ambulatory Visit: Payer: Self-pay | Admitting: General Surgery

## 2021-01-09 DIAGNOSIS — R221 Localized swelling, mass and lump, neck: Secondary | ICD-10-CM

## 2021-01-10 ENCOUNTER — Other Ambulatory Visit: Payer: Self-pay | Admitting: General Surgery

## 2021-01-10 DIAGNOSIS — R922 Inconclusive mammogram: Secondary | ICD-10-CM

## 2021-01-10 DIAGNOSIS — N644 Mastodynia: Secondary | ICD-10-CM

## 2021-01-25 ENCOUNTER — Ambulatory Visit
Admission: RE | Admit: 2021-01-25 | Discharge: 2021-01-25 | Disposition: A | Discharge status: Home / Self Care | Payer: 59 | Source: Ambulatory Visit | Attending: General Surgery | Admitting: General Surgery

## 2021-01-25 DIAGNOSIS — R221 Localized swelling, mass and lump, neck: Secondary | ICD-10-CM

## 2021-02-22 ENCOUNTER — Other Ambulatory Visit: Payer: Self-pay

## 2021-02-22 ENCOUNTER — Ambulatory Visit
Admission: RE | Admit: 2021-02-22 | Discharge: 2021-02-22 | Disposition: A | Discharge status: Home / Self Care | Payer: 59 | Source: Ambulatory Visit | Attending: General Surgery | Admitting: General Surgery

## 2021-02-22 DIAGNOSIS — N644 Mastodynia: Secondary | ICD-10-CM

## 2021-02-22 DIAGNOSIS — R922 Inconclusive mammogram: Secondary | ICD-10-CM

## 2021-08-16 ENCOUNTER — Other Ambulatory Visit: Payer: Self-pay | Admitting: General Surgery

## 2021-08-16 DIAGNOSIS — Z1239 Encounter for other screening for malignant neoplasm of breast: Secondary | ICD-10-CM

## 2021-10-16 ENCOUNTER — Other Ambulatory Visit: Payer: Self-pay

## 2021-10-16 ENCOUNTER — Ambulatory Visit
Admission: RE | Admit: 2021-10-16 | Discharge: 2021-10-16 | Disposition: A | Discharge status: Home / Self Care | Payer: No Typology Code available for payment source | Source: Ambulatory Visit | Attending: General Surgery | Admitting: General Surgery

## 2021-10-16 DIAGNOSIS — Z1239 Encounter for other screening for malignant neoplasm of breast: Secondary | ICD-10-CM

## 2022-02-18 ENCOUNTER — Other Ambulatory Visit: Payer: Self-pay | Admitting: Obstetrics and Gynecology

## 2022-02-18 DIAGNOSIS — R52 Pain, unspecified: Secondary | ICD-10-CM

## 2022-03-19 ENCOUNTER — Other Ambulatory Visit: Payer: Self-pay | Admitting: Obstetrics and Gynecology

## 2022-03-19 DIAGNOSIS — N6310 Unspecified lump in the right breast, unspecified quadrant: Secondary | ICD-10-CM

## 2022-04-11 ENCOUNTER — Ambulatory Visit
Admission: RE | Admit: 2022-04-11 | Discharge: 2022-04-11 | Disposition: A | Discharge status: Home / Self Care | Payer: No Typology Code available for payment source | Source: Ambulatory Visit | Attending: Obstetrics and Gynecology | Admitting: Obstetrics and Gynecology

## 2022-04-11 DIAGNOSIS — N6310 Unspecified lump in the right breast, unspecified quadrant: Secondary | ICD-10-CM

## 2022-10-17 ENCOUNTER — Ambulatory Visit
Admission: EM | Admit: 2022-10-17 | Discharge: 2022-10-17 | Disposition: A | Discharge status: Home / Self Care | Payer: No Typology Code available for payment source | Attending: Physician Assistant | Admitting: Physician Assistant

## 2022-10-17 DIAGNOSIS — J309 Allergic rhinitis, unspecified: Secondary | ICD-10-CM | POA: Diagnosis not present

## 2022-10-17 DIAGNOSIS — H65193 Other acute nonsuppurative otitis media, bilateral: Secondary | ICD-10-CM | POA: Diagnosis not present

## 2022-10-17 MED ORDER — AMOXICILLIN-POT CLAVULANATE 875-125 MG PO TABS: 1.0000 | ORAL_TABLET | Freq: Two times a day (BID) | ORAL | 0 refills | Status: DC

## 2022-10-17 NOTE — ED Triage Notes (Signed)
Pt presents to [facility] with co of congestion and right side otalgia for 2 days. Pt reports throbbing and popping. Pt has not taken any otc meds other than cough drops.

## 2022-10-17 NOTE — ED Provider Notes (Signed)
[facility]    CSN: [id number] Arrival date & time: 10/17/22  0813      History   Chief Complaint Chief Complaint  Patient presents with   Otalgia    HPI Jamie Meza is a 41 y.o. female.   Patient here today for evaluation of right ear pain and popping that started 2 days ago. She reports that pain is throbbing. She has had ear infection in the past as and adult but no other history of same. She has not been taking any medication for symptoms. She denies any fever. She has not had any significant cough.   The history is provided by the patient.    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 01/10/2019   Bleeding tendency ([place]) 09/28/2018   Nonblanching rash 09/16/2018   Spontaneous ecchymosis 09/16/2018   Obesity, Class I, BMI 30-34.9 09/16/2018   Family history of breast cancer in first degree relative 09/16/2018   FHx: early coronary artery disease-father in his late 3s with MI 09/16/2018   Uterine leiomyoma 08/22/2016   Fibroids 08/22/2016   Environmental and seasonal allergies 03/18/2012   Allergic rhinitis 03/18/2012   Routine health maintenance 01/04/2012   UTI (lower urinary tract infection) 09/23/2011   Anemia 06/26/2011   Otalgia of right ear 05/23/2011   Headache(784.0) 05/23/2011   Headache 05/23/2011    Past Surgical History:  Procedure Laterality Date   IR GENERIC HISTORICAL  08/22/2016   IR ANGIOGRAM SELECTIVE EACH ADDITIONAL VESSEL 08/22/2016 Greggory Keen, MD [facility]   IR GENERIC HISTORICAL  08/22/2016   IR ANGIOGRAM SELECTIVE EACH ADDITIONAL VESSEL 08/22/2016 Greggory Keen, MD [facility]   IR GENERIC HISTORICAL  08/22/2016    IR ANGIOGRAM PELVIS SELECTIVE OR SUPRASELECTIVE 08/22/2016 Greggory Keen, MD [facility]   IR GENERIC HISTORICAL  08/22/2016   IR US GUIDE VASC ACCESS RIGHT 08/22/2016 Greggory Keen, MD [facility]   IR GENERIC HISTORICAL  08/22/2016   IR ANGIOGRAM PELVIS SELECTIVE OR SUPRASELECTIVE 08/22/2016 Greggory Keen, MD [facility]   IR GENERIC HISTORICAL  08/22/2016   IR EMBO ARTERIAL NOT HEMORR HEMANG INC GUIDE ROADMAPPING 08/22/2016 Greggory Keen, MD [facility]   IR GENERIC HISTORICAL  06/17/2016   IR RADIOLOGIST EVAL & MGMT 06/17/2016 Greggory Keen, MD [facility]   IR GENERIC HISTORICAL  09/10/2016   IR RADIOLOGIST EVAL & MGMT 09/10/2016 [facility]   TUBAL LIGATION  2006    OB History   No obstetric history on file.      Home Medications    Prior to Admission medications   Medication Sig Start Date End Date Taking? Authorizing Provider  amoxicillin-clavulanate (AUGMENTIN) 875-125 MG tablet Take 1 tablet by mouth every 12 (twelve) hours. 10/17/22  Yes Francene Finders, PA-C  azelastine (ASTELIN) 0.1 % nasal spray Place 1 spray into both nostrils 2 (two) times daily. after sinus rinses 09/06/19   Opalski, Deborah, DO  busPIRone (BUSPAR) 10 MG tablet Take 1 tablet (10 mg total) by mouth 3 (three) times daily. 03/04/19   Opalski, Neoma Laming, DO  FLUoxetine (PROZAC) 20 MG tablet Take 1 tablet (20 mg total) by mouth daily. 03/04/19   Opalski,  Neoma Laming, DO  fluticasone (FLONASE) 50 MCG/ACT nasal spray 1 spray each nostril following sinus rinses twice daily 09/06/19   Opalski, Neoma Laming, DO  levocetirizine (XYZAL) 5 MG tablet Take 1 tablet (5 mg total) by mouth every evening. 10/10/19   Opalski, Neoma Laming, DO  Vitamin D, Ergocalciferol, (DRISDOL) 1.25 MG (50000 UT) CAPS capsule Take 1 capsule (50,000 Units total) by mouth every 7 (seven) days. 11/03/18   Mellody Dance, DO    Family History Family History  Problem Relation Age of Onset   Breast cancer Mother 18   Breast cancer Maternal  Aunt 34   Prostate cancer Maternal Uncle    Stroke Maternal Grandmother    Diabetes Maternal Grandmother    Heart attack Father    Breast cancer Maternal Aunt 41       BRCA negative   Prostate cancer Maternal Uncle     Social History Social History   Tobacco Use   Smoking status: Never   Smokeless tobacco: Never  Vaping Use   Vaping Use: Never used  Substance Use Topics   Alcohol use: No   Drug use: No     Allergies   Patient has no known allergies.   Review of Systems Review of Systems  Constitutional:  Negative for chills and fever.  HENT:  Positive for congestion and ear pain. Negative for sore throat.   Eyes:  Negative for discharge and redness.  Respiratory:  Negative for cough, shortness of breath and wheezing.   Gastrointestinal:  Negative for abdominal pain, diarrhea, nausea and vomiting.     Physical Exam Triage Vital Signs ED Triage Vitals  Enc Vitals Group     BP      Pulse      Resp      Temp      Temp src      SpO2      Weight      Height      Head Circumference      Peak Flow      Pain Score      Pain Loc      Pain Edu?      Excl. in [place]?    No data found.  Updated Vital Signs BP 102/63   Pulse 71   Temp 97.7 F (36.5 C)   Resp 19   LMP 09/24/2022 (Exact Date)   SpO2 98%      Physical Exam Vitals and nursing note reviewed.  Constitutional:      General: She is not in acute distress.    Appearance: Normal appearance. She is not ill-appearing.  HENT:     Head: Normocephalic and atraumatic.     Ears:     Comments: Bilateral Tms injected    Nose: Congestion present.     Mouth/Throat:     Mouth: Mucous membranes are moist.     Pharynx: No oropharyngeal exudate or posterior oropharyngeal erythema.  Eyes:     Conjunctiva/sclera: Conjunctivae normal.  Cardiovascular:     Rate and Rhythm: Normal rate and regular rhythm.     Heart sounds: Normal heart sounds. No murmur heard. Pulmonary:     Effort: Pulmonary effort is normal.  No respiratory distress.     Breath sounds: Normal breath sounds. No wheezing, rhonchi or rales.  Skin:    General: Skin is warm and dry.  Neurological:     Mental Status: She is alert.  Psychiatric:        Mood and Affect: Mood normal.  Thought Content: Thought content normal.      UC Treatments / Results  Labs (all labs ordered are listed, but only abnormal results are displayed) Labs Reviewed - No data to display  EKG   Radiology No results found.  Procedures Procedures (including critical care time)  Medications Ordered in UC Medications - No data to display  Initial Impression / Assessment and Plan / UC Course  I have reviewed the triage vital signs and the nursing notes.  Pertinent labs & imaging results that were available during my care of the patient were reviewed by me and considered in my medical decision making (see chart for details).    Will treat to cover otitis media with augmentin. Encouraged follow up if no gradual improvement or with any further concerns.  Final Clinical Impressions(s) / UC Diagnoses   Final diagnoses:  Other acute nonsuppurative otitis media of both ears, recurrence not specified  Allergic rhinitis, unspecified seasonality, unspecified trigger   Discharge Instructions   None    ED Prescriptions     Medication Sig Dispense Auth. Provider   amoxicillin-clavulanate (AUGMENTIN) 875-125 MG tablet Take 1 tablet by mouth every 12 (twelve) hours. 14 tablet Francene Finders, PA-C      PDMP not reviewed this encounter.   Francene Finders, PA-C 10/17/22 [phone]

## 2023-03-09 LAB — HM PAP SMEAR: HPV, high-risk: NEGATIVE

## 2023-06-26 LAB — HM MAMMOGRAPHY: HM Mammogram: NORMAL (ref 0–4)

## 2023-07-07 ENCOUNTER — Ambulatory Visit (INDEPENDENT_AMBULATORY_CARE_PROVIDER_SITE_OTHER): Payer: No Typology Code available for payment source | Admitting: Obstetrics and Gynecology

## 2023-07-07 ENCOUNTER — Encounter: Payer: Self-pay | Admitting: Obstetrics and Gynecology

## 2023-07-07 VITALS — BP 152/79 | HR 80 | Ht 65.75 in | Wt 192.0 lb

## 2023-07-07 DIAGNOSIS — N814 Uterovaginal prolapse, unspecified: Secondary | ICD-10-CM

## 2023-07-07 DIAGNOSIS — N393 Stress incontinence (female) (male): Secondary | ICD-10-CM | POA: Diagnosis not present

## 2023-07-07 DIAGNOSIS — N816 Rectocele: Secondary | ICD-10-CM

## 2023-07-07 DIAGNOSIS — N8110 Cystocele, unspecified: Secondary | ICD-10-CM

## 2023-07-07 DIAGNOSIS — R350 Frequency of micturition: Secondary | ICD-10-CM

## 2023-07-07 LAB — POCT URINALYSIS DIPSTICK
Bilirubin, UA: NEGATIVE
Blood, UA: NEGATIVE
Glucose, UA: NEGATIVE
Ketones, UA: NEGATIVE
Nitrite, UA: NEGATIVE
Protein, UA: NEGATIVE
Spec Grav, UA: 1.025 (ref 1.010–1.025)
Urobilinogen, UA: 0.2 E.U./dL
pH, UA: 6.5 (ref 5.0–8.0)

## 2023-07-07 NOTE — Patient Instructions (Addendum)
You have stage 2 Anterior Vaginal wall (Bladder wall), Stage 1 Uterine, and Stage 1 Posterior vaginal (rectal wall) Prolapse.       We discussed two options for prolapse repair:  1) vaginal repair without mesh - Pros - safer, no mesh complications - Cons - not as strong as mesh repair, higher risk of recurrence  2) laparoscopic repair with mesh - Pros - stronger, better long-term success - Cons - risks of mesh implant (erosion into vagina or bladder, adhering to the rectum, pain) - these risks are lower than with a vaginal mesh but still exist
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
## 2023-08-07 ENCOUNTER — Encounter: Payer: Self-pay | Admitting: Obstetrics and Gynecology

## 2023-08-07 ENCOUNTER — Ambulatory Visit: Payer: No Typology Code available for payment source | Admitting: Obstetrics and Gynecology

## 2023-08-07 VITALS — BP 99/68 | HR 61

## 2023-08-07 DIAGNOSIS — N812 Incomplete uterovaginal prolapse: Secondary | ICD-10-CM

## 2023-08-07 DIAGNOSIS — N393 Stress incontinence (female) (male): Secondary | ICD-10-CM

## 2023-08-07 DIAGNOSIS — N3281 Overactive bladder: Secondary | ICD-10-CM | POA: Diagnosis not present

## 2023-08-07 NOTE — Progress Notes (Signed)
[facility] Return Visit  SUBJECTIVE  History of Present Illness: BAILLIE NASON is a 42 y.o. female seen in follow-up for prolapse and incontinence. She is interested in surgery.    Past Medical History: Patient  has a past medical history of Recurrent UTI.   Past Surgical History: She  has a past surgical history that includes Tubal ligation (2006); ir generic historical (08/22/2016); ir generic historical (08/22/2016); ir generic historical (08/22/2016); ir generic historical (08/22/2016); ir generic historical (08/22/2016); ir generic historical (08/22/2016); ir generic historical (06/17/2016); and ir generic historical (09/10/2016).   Medications: She has a current medication list which includes the following prescription(s): cetirizine and vitamin d (ergocalciferol).   Allergies: Patient has No Known Allergies.   Social History: Patient  reports that she has never smoked. She has never used smokeless tobacco. She reports current alcohol use. She reports that she does not use drugs.      OBJECTIVE     Physical Exam: Vitals:   08/07/23 0809  BP: 99/68  Pulse: 61   Gen: No apparent distress, A&O x 3.  Detailed Urogynecologic Evaluation:  Normal external genitalia. On speculum, normal vaginal mucosa and normal appearing cervix. On bimanual, uterus is small, mobile and nontender.   Prior exam showed:  POP-Q  -1                                            Aa   -1                                           Ba  -5                                              C   4.5                                            Gh  3.5                                            Pb  8                                            tvl   -2.5                                            Ap  -2.5                                            Bp  -6.5  D   Verbal consent was obtained to perform simple CMG procedure:   Prolapse was reduced  using 2 large cotton swabs. Urethra was prepped with betadine and a 62F catheter was placed and bladder was drained completely. The bladder was then backfilled with sterile water by gravity.  First sensation: First Desire: Strong Desire: Capacity: Cough stress test was negative. Valsalva stress test was positive. Small DO noted at capacity.  Catheter was replaced to drain bladder.   Interpretation: CMG showed normal sensation, and normal cystometric capacity. Findings positive for stress incontinence, positive for detrusor overactivity.        ASSESSMENT AND PLAN    Ms. Breitner is a 42 y.o. with:  1. Uterovaginal prolapse, incomplete   2. SUI (stress urinary incontinence, female)   3. Overactive bladder     Plan for surgery: Exam under anesthesia, robotic assisted total laparoscopic hysterectomy with bilateral salpingectomy, sacrocolpopexy, midurethral sling, cystoscopy, possible perineorrhaphy  - We reviewed the patient's specific anatomic and functional findings, with the assistance of diagrams, and together finalized the above procedure. The planned surgical procedures were discussed along with the surgical risks outlined below, which were also provided on a detailed handout. Additional treatment options including expectant management, conservative management, medical management were discussed where appropriate.  We reviewed the benefits and risks of each treatment option.   General Surgical Risks: For all procedures, there are risks of bleeding, infection, damage to surrounding organs including but not limited to bowel, bladder, blood vessels, ureters and nerves, and need for further surgery if an injury were to occur. These risks are all low with minimally invasive surgery.   There are risks of numbness and weakness at any body site or buttock/rectal pain.  It is possible that baseline pain can be worsened by surgery, either with or without mesh. If surgery is  vaginal, there is also a low risk of possible conversion to laparoscopy or open abdominal incision where indicated. Very rare risks include blood transfusion, blood clot, heart attack, pneumonia, or death.   There is also a risk of short-term postoperative urinary retention with need to use a catheter. About half of patients need to go home from surgery with a catheter, which is then later removed in the office. The risk of long-term need for a catheter is very low. There is also a risk of worsening of overactive bladder.   Sling: The effectiveness of a midurethral vaginal mesh sling is approximately 85%, and thus, there will be times when you may leak urine after surgery, especially if your bladder is full or if you have a strong cough. There is a balance between making the sling tight enough to treat your leakage but not too tight so that you have long-term difficulty emptying your bladder. A mesh sling will not directly treat overactive bladder/urge incontinence and may worsen it.  There is an FDA safety notification on vaginal mesh procedures for prolapse but NOT mesh slings. We have extensive experience and training with mesh placement and we have close postoperative follow up to identify any potential complications from mesh. It is important to realize that this mesh is a permanent implant that cannot be easily removed. There are rare risks of mesh exposure (2-4%), pain with intercourse (0-7%), and infection (<1%). The risk of mesh exposure if more likely in a woman with risks for poor healing (prior radiation, poorly controlled diabetes, or immunocompromised). The risk of new or worsened chronic pain after mesh implant is more  common in women with baseline chronic pain and/or poorly controlled anxiety or depression. Approximately 2-4% of patients will experience longer-term post-operative voiding dysfunction that may require surgical revision of the sling. We also reviewed that postoperatively, her  stream may not be as strong as before surgery.   Prolapse (with or without mesh): Risk factors for surgical failure  include things that put pressure on your pelvis and the surgical repair, including obesity, chronic cough, and heavy lifting or straining (including lifting children or adults, straining on the toilet, or lifting heavy objects such as furniture or anything weighing >25 lbs. Risks of recurrence is 20-30% with vaginal native tissue repair and a less than 10% with sacrocolpopexy with mesh.    Sacrocolpopexy: Mesh implants may provide more prolapse support, but do have some unique risks to consider. It is important to understand that mesh is permanent and cannot be easily removed. Risks of abdominal sacrocolpopexy mesh include mesh exposure (~3-6%), painful intercourse (recent studies show lower rates after surgery compared to before, with ~5-8% risk of new onset), and very rare risks of bowel or bladder injury or infection (<1%). The risk of mesh exposure is more likely in a woman with risks for poor healing (prior radiation, poorly controlled diabetes, or immunocompromised). The risk of new or worsened chronic pain after mesh implant is more common in women with baseline chronic pain and/or poorly controlled anxiety or depression. There is an FDA safety notification on vaginal mesh procedures for prolapse but NOT abdominal mesh procedures and therefore does not apply to your surgery. We have extensive experience and training with mesh placement and we have close postoperative follow up to identify any potential complications from mesh.    - For preop Visit:  She is required to have a visit within 30 days of her surgery.   - Medical clearance: not required  - Anticoagulant use: No - Medicaid Hysterectomy form: No - Accepts blood transfusion: Yes - Expected length of stay: outpatient  Request sent for surgery scheduling.   Marguerita Beards, MD

## 2023-08-26 ENCOUNTER — Ambulatory Visit (INDEPENDENT_AMBULATORY_CARE_PROVIDER_SITE_OTHER): Payer: No Typology Code available for payment source | Admitting: Internal Medicine

## 2023-08-26 ENCOUNTER — Encounter: Payer: Self-pay | Admitting: Internal Medicine

## 2023-08-26 VITALS — BP 110/74 | HR 71 | Temp 98.5°F | Ht 65.0 in | Wt 198.4 lb

## 2023-08-26 DIAGNOSIS — R635 Abnormal weight gain: Secondary | ICD-10-CM | POA: Diagnosis not present

## 2023-08-26 DIAGNOSIS — N926 Irregular menstruation, unspecified: Secondary | ICD-10-CM | POA: Diagnosis not present

## 2023-08-26 DIAGNOSIS — Z803 Family history of malignant neoplasm of breast: Secondary | ICD-10-CM

## 2023-08-26 DIAGNOSIS — Z6833 Body mass index (BMI) 33.0-33.9, adult: Secondary | ICD-10-CM

## 2023-08-26 DIAGNOSIS — Z7689 Persons encountering health services in other specified circumstances: Secondary | ICD-10-CM

## 2023-08-26 DIAGNOSIS — Z800 Family history of malignant neoplasm of digestive organs: Secondary | ICD-10-CM

## 2023-08-26 DIAGNOSIS — Z8249 Family history of ischemic heart disease and other diseases of the circulatory system: Secondary | ICD-10-CM

## 2023-08-26 MED ORDER — ZEPBOUND 2.5 MG/0.5ML ~~LOC~~ SOAJ: 2.5000 mg | SUBCUTANEOUS | 0 refills | Status: DC

## 2023-08-26 NOTE — Patient Instructions (Addendum)
Golden milk recipe Magnesium glycinate take at night  Core Strength Exercises Ask your health care provider which exercises are safe for you. Do exercises exactly as told by your health care provider and adjust them as directed. It is normal to feel mild stretching, pulling, tightness, or discomfort as you do these exercises. Stop right away if you feel sudden pain or your pain gets worse. Do not begin these exercises until told by your health care provider. Benefits of core strength exercises Core exercises help to build strength in the muscles between your ribs and your hips (abdominal muscles). These muscles help to support your body and keep your spine stable. It is important to maintain strength in your core to prevent injury and pain. Some activities, such as yoga and Pilates, can help to strengthen core muscles. You can also strengthen core muscles with exercises at home. It is important to talk to your health care provider before you start a new exercise routine. Core strength exercises can: Reduce back pain. Help to rebuild strength after a back or spine injury. Help to prevent injury during physical activity, especially injuries to the back, hips, and knees. How to do core strength exercises Repeat these exercises 10-15 times, or until you are tired. Stop if you feel any pain while doing these exercises. Contact your health care provider if your pain continues or gets worse while doing or after doing core strength exercises. For strength exercises that are done on the floor, use a padded yoga mat or an exercise mat. Bridging  Lie on your back on a firm surface with your knees bent and your feet flat on the floor. Raise your hips so that your knees, hips, and shoulders together form a straight line. Do not excessively arch your back. Keep your abdominal muscles tight. Hold this position for 3-5 seconds. Slowly lower your hips to the starting position. Let your muscles relax completely  between repetitions. Single-leg bridge  Lie on your back on a firm surface with your knees bent and your feet flat on the floor. Raise your hips so that your knees, hips, and shoulders together form a straight line. Do not excessively arch your back. Keep your abdominal muscles tight. Lift one foot off the floor while maintaining alignment in your knees, hips, and shoulders. Then, completely straighten the lifted leg. Hold this position for 3-5 seconds. Put the straight leg back down in the bent position. Slowly lower your hips to the starting position. Repeat these steps using your other leg. Side bridge  Lie on your side with your knees bent. Prop yourself up on the elbow that is near the floor. Using your abdominal muscles and the elbow you are propped up on, raise your body off the floor. Raise your hip so that your shoulder, hip, and foot together form a straight line. Hold this position for 10 seconds. Keep your head and neck raised and away from your shoulder (in their normal, neutral position). Keep your abdominal muscles tight. Slowly lower your hip to the starting position. Repeat and try to hold this position longer, working your way up to 30 seconds. Abdominal crunch  Lie on your back on a firm surface. Bend your knees and keep your feet flat on the floor. Cross your arms over your chest. Without bending your neck, tip your chin slightly toward your chest. Tighten your abdominal muscles as you lift your chest just high enough to lift your shoulder blades off the floor. Do not hold your  breath. You can do this with short lifts or long lifts. Slowly return to the starting position. Bird dog  Get on your hands and knees, with your legs shoulder-width apart and your arms under your shoulders. Keep your back straight. Tighten your abdominal muscles. Raise one of your legs off the floor and straighten it. Try to keep it parallel to the floor. Slowly lower your leg to the starting  position. Raise one of your arms off the floor and straighten it. Try to keep it parallel to the floor. Slowly lower your arm to the starting position. Repeat with the other arm and leg. If possible, try raising a leg and an arm at the same time, on opposite sides of the body. For example, raise your left hand and your right leg. Raford Pitcher on your belly. Prop up your body onto your forearms and your feet, keeping your legs straight. Your body should make a straight line between your shoulders and feet. Hold this position for 10 seconds while keeping your abdominal muscles tight. Lower your body to the starting position. Repeat and try to hold this position longer, working your way up to 30 seconds. Cross-core strengthening  Stand with your feet shoulder-width apart. Hold a ball out in front of you. Keep your arms straight. Tighten your abdominal muscles and slowly rotate at your waist from side to side. Keep your feet flat. Once you are comfortable, try repeating this exercise with a heavier ball. Top core strengthening  Stand about 18 inches (46 cm) out from a wall, with your back to the wall. Keep your feet flat and shoulder-width apart. Tighten your abdominal muscles. Bend your hips and knees. Slowly reach between your legs to touch the wall behind you. Slowly stand back up. Raise your arms over your head and reach behind you. Return to the starting position. General tips Do not do any exercises that cause pain. If you have pain while exercising, talk to your health care provider. Always stretch before and after doing these exercises. This can help prevent injury. Maintain a healthy weight. Ask your health care provider what weight is healthy for you. Contact a health care provider if: You have back pain that gets worse or does not go away. You feel pain while doing core strength exercises. Get help right away if: You have severe pain that does not get better with  medicine. Summary Core exercises help to build strength in the muscles between your ribs and your waist. Core muscles help to support your body and keep your spine stable. Some activities, such as yoga and Pilates, can help to strengthen core muscles. Core strength exercises can help back pain and can prevent injury. Stop if you feel any pain while doing core strength exercises. This information is not intended to replace advice given to you by your health care provider. Make sure you discuss any questions you have with your health care provider. Document Revised: 05/21/2021 Document Reviewed: 08/21/2020 Elsevier Patient Education  2024 [employer].

## 2023-08-26 NOTE — Progress Notes (Addendum)
I,Victoria T Deloria Lair, CMA,acting as a Neurosurgeon for Gwynneth Aliment, MD.,have documented all relevant documentation on the behalf of Gwynneth Aliment, MD,as directed by  Gwynneth Aliment, MD while in the presence of Gwynneth Aliment, MD.  Subjective:  Patient ID: Jamie Meza , female    DOB: [birth year] , 42 y.o.   MRN: [id number]  Chief Complaint  Patient presents with   Establish Care    HPI  Patient presents today to establish care. Previous pcp: Dr Sharee Holster, [facility]. She is self-referred.  She is divorced, mother of three children.  Originally from [place], but raised in [place]. 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 She reports being without pcp for 4 years.  She went to [employer] for [employer].  Last physical: 3 years ago.  GYN: Dr Cherly Hensen. She admits regularly visiting GYN.   She would like to discuss menopause. She feels she is going through hormonal weight gain. She admits working out regularly along with changing her diet.  She feels she has gained 25 lbs in the past year. She exercises regularly. She used to be an avid runner.  She divorced in 2023.  She is currently practicing intermittent fasting.  She has a regular diet, but does prefer to limit her intake of red meat.   She admits hot flashes, night sweats, fatigue. LMP -07/23/23, they are irregular She reports this issue has been ongoing for a year in a half.    She reports colon cancer runs in her family. She reports her cousin on her mothers side is currently going through the process of chemo.   Letters sent for mammogram & pap.      Past Medical History:  Diagnosis Date   Recurrent UTI    occur yearly     Family History  Problem Relation Age of Onset   Breast cancer Mother 60   Breast cancer Maternal Aunt 34   Prostate cancer Maternal Uncle    Stroke Maternal Grandmother    Diabetes Maternal Grandmother    Heart attack Father    Breast cancer Maternal Aunt 45       BRCA negative   Prostate  cancer Maternal Uncle      Current Outpatient Medications:    cetirizine (ZYRTEC) 10 MG chewable tablet, Chew 10 mg by mouth daily., Disp: , Rfl:    tirzepatide (ZEPBOUND) 2.5 MG/0.5ML Pen, Inject 2.5 mg into the skin once a week., Disp: 2 mL, Rfl: 0   naproxen (NAPROSYN) 500 MG tablet, Take by mouth. (Patient not taking: Reported on 08/26/2023), Disp: , Rfl:    Vitamin D, Ergocalciferol, (DRISDOL) 1.25 MG (50000 UT) CAPS capsule, Take 1 capsule (50,000 Units total) by mouth every 7 (seven) days. (Patient not taking: Reported on 08/26/2023), Disp: 12 capsule, Rfl: 3   No Known Allergies   Review of Systems  Constitutional:  Positive for unexpected weight change.  Respiratory: Negative.    Cardiovascular: Negative.   Gastrointestinal: Negative.   Neurological: Negative.   Psychiatric/Behavioral: Negative.       Today's Vitals   08/26/23 1405  BP: 110/74  Pulse: 71  Temp: 98.5 F (36.9 C)  SpO2: 98%  Weight: 198 lb 6.4 oz (90 kg)  Height: 5\' 5"  (1.651 m)   Body mass index is 33.02 kg/m.  Wt Readings from Last 3 Encounters:  08/26/23 198 lb 6.4 oz (90 kg)  07/07/23 192 lb (87.1 kg)  10/10/19 176 lb (79.8 kg)  Objective:  Physical Exam Vitals and nursing note reviewed.  Constitutional:      Appearance: Normal appearance.  HENT:     Head: Normocephalic and atraumatic.  Eyes:     Extraocular Movements: Extraocular movements intact.  Cardiovascular:     Rate and Rhythm: Normal rate and regular rhythm.     Heart sounds: Normal heart sounds.  Pulmonary:     Effort: Pulmonary effort is normal.     Breath sounds: Normal breath sounds.  Musculoskeletal:     Cervical back: Normal range of motion.  Skin:    General: Skin is warm.  Neurological:     General: No focal deficit present.     Mental Status: She is alert.  Psychiatric:        Mood and Affect: Mood normal.        Behavior: Behavior normal.         Assessment And Plan:  Weight gain Assessment &  Plan: I will check labs as below. She was advised remove artificially sweetened drinks from her diet, she will continue with her current exercise regimen.   Orders: -     CBC -     CMP14+EGFR -     Lipid panel -     Hemoglobin A1c -     TSH  Irregular menses Assessment & Plan: Sx possibly due to perimenopause. Encouraged to remove processed foods from her diet and to consider golden milk (turmeric/ginger tea) to help improve her associated sx of hot flashes and night sweats.  Orders: -     TSH  Family history of heart disease -     Lipoprotein A (LPA)  Family history of breast cancer in first degree relative -     Ambulatory referral to [facility]  Family history of colon cancer  Encounter to establish care with new doctor  Other orders -     Zepbound; Inject 2.5 mg into the skin once a week.  Dispense: 2 mL; Refill: 0  She is encouraged to strive for BMI less than 30 to decrease cardiac risk. Advised to aim for at least 150 minutes of exercise per week.    Return in 4 months (on 12/26/2023), or schedule physical please, for 4 month phys. .  Patient was given opportunity to ask questions. Patient verbalized understanding of the plan and was able to repeat key elements of the plan. All questions were answered to their satisfaction.    I, Gwynneth Aliment, MD, have reviewed all documentation for this visit. The documentation on 08/27/23 for the exam, diagnosis, procedures, and orders are all accurate and complete.   IF YOU HAVE BEEN REFERRED TO A SPECIALIST, IT MAY TAKE 1-2 WEEKS TO SCHEDULE/PROCESS THE REFERRAL. IF YOU HAVE NOT HEARD FROM US/SPECIALIST IN TWO WEEKS, PLEASE GIVE [name] A CALL AT [phone].   THE PATIENT IS ENCOURAGED TO PRACTICE SOCIAL DISTANCING DUE TO THE COVID-19 PANDEMIC.

## 2023-08-27 DIAGNOSIS — N926 Irregular menstruation, unspecified: Secondary | ICD-10-CM | POA: Insufficient documentation

## 2023-08-27 DIAGNOSIS — E6609 Other obesity due to excess calories: Secondary | ICD-10-CM | POA: Insufficient documentation

## 2023-08-27 DIAGNOSIS — R635 Abnormal weight gain: Secondary | ICD-10-CM | POA: Insufficient documentation

## 2023-08-27 LAB — LIPID PANEL
Chol/HDL Ratio: 2.5 ratio (ref 0.0–4.4)
Cholesterol, Total: 180 mg/dL (ref 100–199)
HDL: 71 mg/dL
LDL Chol Calc (NIH): 97 mg/dL (ref 0–99)
Triglycerides: 62 mg/dL (ref 0–149)
VLDL Cholesterol Cal: 12 mg/dL (ref 5–40)

## 2023-08-27 LAB — CMP14+EGFR
ALT: 22 IU/L (ref 0–32)
AST: 25 IU/L (ref 0–40)
Albumin: 4.7 g/dL (ref 3.9–4.9)
Alkaline Phosphatase: 68 IU/L (ref 44–121)
BUN/Creatinine Ratio: 13 (ref 9–23)
BUN: 10 mg/dL (ref 6–24)
Bilirubin Total: 0.3 mg/dL (ref 0.0–1.2)
CO2: 21 mmol/L (ref 20–29)
Calcium: 9.9 mg/dL (ref 8.7–10.2)
Chloride: 103 mmol/L (ref 96–106)
Creatinine, Ser: 0.79 mg/dL (ref 0.57–1.00)
Globulin, Total: 2.7 g/dL (ref 1.5–4.5)
Glucose: 79 mg/dL (ref 70–99)
Potassium: 4.5 mmol/L (ref 3.5–5.2)
Sodium: 139 mmol/L (ref 134–144)
Total Protein: 7.4 g/dL (ref 6.0–8.5)
eGFR: 95 mL/min/{1.73_m2} (ref >59)

## 2023-08-27 LAB — LIPOPROTEIN A (LPA): Lipoprotein (a): 84.2 nmol/L — ABNORMAL HIGH (ref <75.0)

## 2023-08-27 LAB — CBC
Hematocrit: 36.4 % (ref 34.0–46.6)
Hemoglobin: 11.5 g/dL (ref 11.1–15.9)
MCH: 29.9 pg (ref 26.6–33.0)
MCHC: 31.6 g/dL (ref 31.5–35.7)
MCV: 95 fL (ref 79–97)
Platelets: 268 10*3/uL (ref 150–450)
RBC: 3.84 x10E6/uL (ref 3.77–5.28)
RDW: 12.5 % (ref 11.7–15.4)
WBC: 10 10*3/uL (ref 3.4–10.8)

## 2023-08-27 LAB — HEMOGLOBIN A1C
Est. average glucose Bld gHb Est-mCnc: 108 mg/dL
Hgb A1c MFr Bld: 5.4 % (ref 4.8–5.6)

## 2023-08-27 LAB — TSH: TSH: 2.41 u[IU]/mL (ref 0.450–4.500)

## 2023-08-27 NOTE — Assessment & Plan Note (Signed)
Sx possibly due to perimenopause. Encouraged to remove processed foods from her diet and to consider golden milk (turmeric/ginger tea) to help improve her associated sx of hot flashes and night sweats.

## 2023-08-27 NOTE — Assessment & Plan Note (Signed)
She is encouraged to strive for BMI less than 30 to decrease cardiac risk. Advised to aim for at least 150 minutes of exercise per week.

## 2023-08-27 NOTE — Assessment & Plan Note (Signed)
I will check labs as below. She was advised remove artificially sweetened drinks from her diet, she will continue with her current exercise regimen.

## 2023-09-10 ENCOUNTER — Other Ambulatory Visit: Payer: Self-pay | Admitting: Internal Medicine

## 2023-09-10 ENCOUNTER — Encounter: Payer: Self-pay | Admitting: Internal Medicine

## 2023-09-10 DIAGNOSIS — Z8249 Family history of ischemic heart disease and other diseases of the circulatory system: Secondary | ICD-10-CM

## 2023-09-14 ENCOUNTER — Inpatient Hospital Stay: Payer: No Typology Code available for payment source | Attending: Hematology | Admitting: Hematology

## 2023-09-14 ENCOUNTER — Encounter: Payer: Self-pay | Admitting: Hematology

## 2023-09-14 ENCOUNTER — Inpatient Hospital Stay: Payer: No Typology Code available for payment source

## 2023-09-14 VITALS — BP 114/74 | HR 75 | Temp 98.2°F | Resp 18 | Ht 66.5 in | Wt 199.5 lb

## 2023-09-14 DIAGNOSIS — Z803 Family history of malignant neoplasm of breast: Secondary | ICD-10-CM | POA: Diagnosis not present

## 2023-09-14 DIAGNOSIS — Z800 Family history of malignant neoplasm of digestive organs: Secondary | ICD-10-CM | POA: Diagnosis not present

## 2023-09-14 DIAGNOSIS — E669 Obesity, unspecified: Secondary | ICD-10-CM | POA: Diagnosis present

## 2023-09-14 DIAGNOSIS — Z6831 Body mass index (BMI) 31.0-31.9, adult: Secondary | ICD-10-CM | POA: Insufficient documentation

## 2023-09-14 DIAGNOSIS — Z8041 Family history of malignant neoplasm of ovary: Secondary | ICD-10-CM | POA: Insufficient documentation

## 2023-09-14 MED ORDER — DIAZEPAM 5 MG PO TABS: 5.0000 mg | ORAL_TABLET | Freq: Once | ORAL | 0 refills | Status: DC

## 2023-09-14 NOTE — Progress Notes (Signed)
[facility]   [phone] [phone]   Clinic New Consult Note   Patient Care Team: Dorothyann Peng, MD as PCP - General (Internal Medicine) Glendale Chard, DO as Consulting Physician (Neurology) Maxie Better, MD as Consulting Physician (Obstetrics and Gynecology) 09/14/2023  CHIEF COMPLAINTS/PURPOSE OF CONSULTATION:  Family history of breast cancer  Referring physician: Dorothyann Peng, MD  Discussed the use of AI scribe software for clinical note transcription with the patient, who gave verbal consent to proceed.  HISTORY OF PRESENTING ILLNESS:  Jamie Meza 42 y.o. female with a significant family history of breast cancer presents for a consultation regarding her high risk of developing the disease.  She presents to the clinic by herself.  I previously saw her in 2019 for possible bleeding disorder.   The patient's mother and two maternal aunts were diagnosed with breast cancer in their early forties. The patient has been proactive in her health management, undergoing regular mammograms for several years, all of which have been normal. She has not had any personal history of cancer. The patient also reports a family history of colon cancer and prostate cancer. She has not had any symptoms suggestive of colon cancer such as changes in bowel habits or blood in the stool. The patient reports she has not had a genetic testing by her gynecologist 2 to 3 years ago.  I do not have the report available.  She will call to report was not significantly abnormal but not completely negative.     MEDICAL HISTORY:  Past Medical History:  Diagnosis Date   Recurrent UTI    occur yearly    SURGICAL HISTORY: Past Surgical History:  Procedure Laterality Date   IR GENERIC HISTORICAL  08/22/2016   IR ANGIOGRAM SELECTIVE EACH ADDITIONAL VESSEL 08/22/2016 Berdine Dance, MD [facility]   IR GENERIC HISTORICAL  08/22/2016   IR ANGIOGRAM SELECTIVE EACH  ADDITIONAL VESSEL 08/22/2016 Berdine Dance, MD [facility]   IR GENERIC HISTORICAL  08/22/2016   IR ANGIOGRAM PELVIS SELECTIVE OR SUPRASELECTIVE 08/22/2016 Berdine Dance, MD [facility]   IR GENERIC HISTORICAL  08/22/2016   IR US GUIDE VASC ACCESS RIGHT 08/22/2016 Berdine Dance, MD [facility]   IR GENERIC HISTORICAL  08/22/2016   IR ANGIOGRAM PELVIS SELECTIVE OR SUPRASELECTIVE 08/22/2016 Berdine Dance, MD [facility]   IR GENERIC HISTORICAL  08/22/2016   IR EMBO ARTERIAL NOT HEMORR HEMANG INC GUIDE ROADMAPPING 08/22/2016 Berdine Dance, MD [facility]   IR GENERIC HISTORICAL  06/17/2016   IR RADIOLOGIST EVAL & MGMT 06/17/2016 Berdine Dance, MD [facility]   IR GENERIC HISTORICAL  09/10/2016   IR RADIOLOGIST EVAL & MGMT 09/10/2016 [facility]   TUBAL LIGATION  2006    SOCIAL HISTORY: Social History   Socioeconomic History   Marital status: Divorced    Spouse name: Not on file   Number of children: 3   Years of education: 16   Highest education level: Not on file  Occupational History    Employer: [employer]  Tobacco Use   Smoking status: Never   Smokeless tobacco: Never  Vaping Use   Vaping status: Never Used  Substance and Sexual Activity   Alcohol use: Yes    Comment: socially   Drug use: No   Sexual activity: Not Currently  Other Topics Concern   Not on file  Social History Narrative   Insurance underwriter- criminal justice.   4 children- oldest is 41 girl, 51 yr old step  son, 44 yr old daughter, 5 year son   Married.  Lives in a 2 story home.    Works for [employer].   Social Determinants of Health   Financial Resource Strain: Not on file  Food Insecurity: Not on file  Transportation Needs: Not on file  Physical Activity: Not on file  Stress: Not on file  Social Connections: Unknown (06/17/2023)   Received from [facility]   Social Network    Social Network: Not on file  Intimate Partner Violence: Unknown (06/17/2023)   Received from [facility]    HITS    Physically Hurt: Not on file    Insult or Talk Down To: Not on file    Threaten Physical Harm: Not on file    Scream or Curse: Not on file    FAMILY HISTORY: Family History  Problem Relation Age of Onset   Breast cancer Mother 57   Heart attack Father    Breast cancer Maternal Aunt 34   Breast cancer Maternal Aunt 45       BRCA negative   Prostate cancer Maternal Uncle    Prostate cancer Maternal Uncle    Stroke Maternal Grandmother    Diabetes Maternal Grandmother    Cancer Cousin 7       colon cancer    ALLERGIES:  has No Known Allergies.  MEDICATIONS:  Current Outpatient Medications  Medication Sig Dispense Refill   diazepam (VALIUM) 5 MG tablet Take 1 tablet (5 mg total) by mouth once for 1 dose. Take 60-90 minutes before your MRI. If not enough, OK to take the second pill 15-30 minutes before MRI 2 tablet 0   cetirizine (ZYRTEC) 10 MG chewable tablet Chew 10 mg by mouth daily.     naproxen (NAPROSYN) 500 MG tablet Take by mouth. (Patient not taking: Reported on 08/26/2023)     tirzepatide (ZEPBOUND) 2.5 MG/0.5ML Pen Inject 2.5 mg into the skin once a week. 2 mL 0   Vitamin D, Ergocalciferol, (DRISDOL) 1.25 MG (50000 UT) CAPS capsule Take 1 capsule (50,000 Units total) by mouth every 7 (seven) days. (Patient not taking: Reported on 08/26/2023) 12 capsule 3   No current facility-administered medications for this visit.    REVIEW OF SYSTEMS:   Constitutional: Denies fevers, chills or abnormal night sweats Eyes: Denies blurriness of vision, double vision or watery eyes Ears, nose, mouth, throat, and face: Denies mucositis or sore throat Respiratory: Denies cough, dyspnea or wheezes Cardiovascular: Denies palpitation, chest discomfort or lower extremity swelling Gastrointestinal:  Denies nausea, heartburn or change in bowel habits Skin: Denies abnormal skin rashes Lymphatics: Denies new lymphadenopathy or easy bruising Neurological:Denies numbness, tingling or new  weaknesses Behavioral/Psych: Mood is stable, no new changes  All other systems were reviewed with the patient and are negative.  PHYSICAL EXAMINATION: ECOG PERFORMANCE STATUS: 0 - Asymptomatic  Vitals:   09/14/23 1512  BP: 114/74  Pulse: 75  Resp: 18  Temp: 98.2 F (36.8 C)  SpO2: 100%   Filed Weights   09/14/23 1512  Weight: 199 lb 8 oz (90.5 kg)    GENERAL:alert, no distress and comfortable SKIN: skin color, texture, turgor are normal, no rashes or significant lesions EYES: normal, conjunctiva are pink and non-injected, sclera clear OROPHARYNX:no exudate, no erythema and lips, buccal mucosa, and tongue normal  NECK: supple, thyroid normal size, non-tender, without nodularity LYMPH:  no palpable lymphadenopathy in the cervical, axillary or inguinal LUNGS: clear to auscultation and percussion with normal breathing effort HEART: regular  rate & rhythm and no murmurs and no lower extremity edema ABDOMEN:abdomen soft, non-tender and normal bowel sounds Musculoskeletal:no cyanosis of digits and no clubbing  PSYCH: alert & oriented x 3 with fluent speech NEURO: no focal motor/sensory deficits  LABORATORY DATA:  I have reviewed the data as listed    Latest Ref Rng & Units 08/26/2023    3:09 PM 11/01/2018    9:54 AM 09/28/2018   10:55 AM  CBC  WBC 3.4 - 10.8 x10E3/uL 10.0  8.5  8.5   Hemoglobin 11.1 - 15.9 g/dL 13.2  44.0  10.2   Hematocrit 34.0 - 46.6 % 36.4  33.2  36.3   Platelets 150 - 450 x10E3/uL 268  289  259      RADIOGRAPHIC STUDIES: I have personally reviewed the radiological images as listed and agreed with the findings in the report. No results found.  ASSESSMENT & PLAN:      Moderate Risk for Breast Cancer -Family history of breast cancer in mother and two maternal aunts before age of 85, and maternal grandmother with ovarian cancer. Patient has had regular mammograms with no abnormal findings. Genetic testing was done two years ago but result is not  available.  -Schedule genetic counseling to review previous genetic testing and determine if further testing is needed. -Her calculated lifetime risk of breast cancer is 13.5% by using the Tyrer-Cuzick model, which is consider moderate (not high) risk.  -Consider breast MRI for additional screening, prescribe Valium for claustrophobia related to MRI procedure. -Continue annual mammograms, next due in July 2025. -Encourage healthy lifestyle including diet, exercise, and weight management to reduce risk. -I dionot recommend chemoprevention with tamoxifen or AI.  Colon Cancer Risk First cousin on maternal side currently undergoing chemotherapy for colon cancer. -Obtain genetic testing results to assess risk. -Plan for screening colonoscopy by age 70 if her insurance approves, or earlier if there are changes in bowel habits or other concerns.  General Health Maintenance -Continue Vitamin D supplementation. -Consider weight management strategies as BMI is 31, indicating overweight status.      Plan -I will refer her to our genetics -She is open to screening MRI which was ordered by Dr. Dwain Sarna in the past, I prescribed Valium for her as needed before MRI -I will see her as needed in future.   Orders Placed This Encounter  Procedures   Ambulatory referral to Genetics    Referral Priority:   Routine    Referral Type:   Consultation    Referral Reason:   Specialty Services Required    Number of Visits Requested:   1    All questions were answered. The patient knows to call the clinic with any problems, questions or concerns. I spent 30 minutes counseling the patient face to face. The total time spent in the appointment was 45 minutes and more than 50% was on counseling.     Malachy Mood, MD 09/14/2023

## 2023-09-17 ENCOUNTER — Ambulatory Visit (HOSPITAL_COMMUNITY)
Admission: RE | Admit: 2023-09-17 | Discharge: 2023-09-17 | Disposition: A | Discharge status: Home / Self Care | Payer: No Typology Code available for payment source | Source: Ambulatory Visit | Attending: Internal Medicine | Admitting: Internal Medicine

## 2023-09-17 DIAGNOSIS — Z8249 Family history of ischemic heart disease and other diseases of the circulatory system: Secondary | ICD-10-CM

## 2023-09-21 ENCOUNTER — Encounter (HOSPITAL_COMMUNITY): Payer: Self-pay

## 2023-09-21 ENCOUNTER — Other Ambulatory Visit (HOSPITAL_COMMUNITY): Payer: No Typology Code available for payment source

## 2023-09-25 ENCOUNTER — Ambulatory Visit (HOSPITAL_COMMUNITY)
Admission: RE | Admit: 2023-09-25 | Discharge: 2023-09-25 | Disposition: A | Discharge status: Home / Self Care | Payer: No Typology Code available for payment source | Source: Ambulatory Visit | Attending: Internal Medicine | Admitting: Internal Medicine

## 2023-09-25 DIAGNOSIS — Z8249 Family history of ischemic heart disease and other diseases of the circulatory system: Secondary | ICD-10-CM | POA: Insufficient documentation

## 2023-10-05 ENCOUNTER — Encounter: Payer: Self-pay | Admitting: Genetic Counselor

## 2023-10-05 DIAGNOSIS — Z1379 Encounter for other screening for genetic and chromosomal anomalies: Secondary | ICD-10-CM | POA: Insufficient documentation

## 2023-10-06 ENCOUNTER — Inpatient Hospital Stay: Payer: No Typology Code available for payment source

## 2023-10-06 ENCOUNTER — Inpatient Hospital Stay: Payer: No Typology Code available for payment source | Admitting: Genetic Counselor

## 2023-10-07 ENCOUNTER — Encounter: Payer: Self-pay | Admitting: Obstetrics and Gynecology

## 2023-10-07 ENCOUNTER — Telehealth: Payer: Self-pay | Admitting: Genetic Counselor

## 2023-10-07 ENCOUNTER — Ambulatory Visit (INDEPENDENT_AMBULATORY_CARE_PROVIDER_SITE_OTHER): Payer: No Typology Code available for payment source | Admitting: Obstetrics and Gynecology

## 2023-10-07 ENCOUNTER — Telehealth: Payer: Self-pay

## 2023-10-07 VITALS — BP 102/72 | HR 66 | Wt 202.0 lb

## 2023-10-07 DIAGNOSIS — Z01818 Encounter for other preprocedural examination: Secondary | ICD-10-CM

## 2023-10-07 MED ORDER — ACETAMINOPHEN 500 MG PO TABS: 500.0000 mg | ORAL_TABLET | Freq: Four times a day (QID) | ORAL | 0 refills | Status: DC | PRN

## 2023-10-07 MED ORDER — OXYCODONE HCL 5 MG PO TABS: 5.0000 mg | ORAL_TABLET | ORAL | 0 refills | Status: DC | PRN

## 2023-10-07 MED ORDER — POLYETHYLENE GLYCOL 3350 17 GM/SCOOP PO POWD: 17.0000 g | Freq: Every day | ORAL | 0 refills | Status: DC

## 2023-10-07 MED ORDER — IBUPROFEN 600 MG PO TABS: 600.0000 mg | ORAL_TABLET | Freq: Four times a day (QID) | ORAL | 0 refills | Status: DC | PRN

## 2023-10-07 NOTE — H&P (Signed)
[facility] H&P  Subjective Chief Complaint: Jamie Meza presents for a preoperative encounter.   History of Present Illness: Jamie Meza is a 42 y.o. female who presents for preoperative visit.  She is scheduled to undergo Exam under anesthesia, robotic assisted total laparoscopic hysterectomy with bilateral salpingectomy, sacrocolpopexy, midurethral sling, cystoscopy, possible perineorrhaphy  on 10/20/23.  Her symptoms include pelvic organ prolapse and stress urinary incontinence, and she was was found to have Stage II anterior, Stage I posterior, Stage I apical prolapse.   Simple CMG showed: First sensation: First Desire: Strong Desire: Capacity: Cough stress test was negative. Valsalva stress test was positive. Small DO noted at capacity.  Catheter was replaced to drain bladder.    Past Medical History:  Diagnosis Date   Recurrent UTI    occur yearly     Past Surgical History:  Procedure Laterality Date   IR GENERIC HISTORICAL  08/22/2016   IR ANGIOGRAM SELECTIVE EACH ADDITIONAL VESSEL 08/22/2016 Berdine Dance, MD [facility]   IR GENERIC HISTORICAL  08/22/2016   IR ANGIOGRAM SELECTIVE EACH ADDITIONAL VESSEL 08/22/2016 Berdine Dance, MD [facility]   IR GENERIC HISTORICAL  08/22/2016   IR ANGIOGRAM PELVIS SELECTIVE OR SUPRASELECTIVE 08/22/2016 Berdine Dance, MD [facility]   IR GENERIC HISTORICAL  08/22/2016   IR US GUIDE VASC ACCESS RIGHT 08/22/2016 Berdine Dance, MD [facility]   IR GENERIC HISTORICAL  08/22/2016   IR ANGIOGRAM PELVIS SELECTIVE OR SUPRASELECTIVE 08/22/2016 Berdine Dance, MD [facility]   IR GENERIC HISTORICAL  08/22/2016   IR EMBO ARTERIAL NOT HEMORR HEMANG INC GUIDE ROADMAPPING 08/22/2016 Berdine Dance, MD [facility]   IR GENERIC HISTORICAL  06/17/2016   IR RADIOLOGIST EVAL & MGMT 06/17/2016 Berdine Dance, MD [facility]   IR GENERIC HISTORICAL  09/10/2016   IR RADIOLOGIST EVAL & MGMT 09/10/2016 [facility]   TUBAL LIGATION  2006    has No Known Allergies.   Family History  Problem Relation Age of Onset   Breast cancer Mother 52   Heart attack Father    Breast cancer Maternal Aunt 34   Breast cancer Maternal Aunt 45       BRCA negative   Prostate cancer Maternal Uncle    Prostate cancer Maternal Uncle    Stroke Maternal Grandmother    Diabetes Maternal Grandmother    Cancer Cousin 74       colon cancer    Social History   Tobacco Use   Smoking status: Never   Smokeless tobacco: Never  Vaping Use   Vaping status: Never Used  Substance Use Topics   Alcohol use: Yes    Comment: socially   Drug use: No     Review of Systems was negative for a full 10 system review except as noted in the History of Present Illness.  No current facility-administered medications for this encounter.  Current Outpatient Medications:    acetaminophen (TYLENOL) 500 MG tablet, Take 1 tablet (500 mg total) by mouth every 6 (six) hours as needed (pain)., Disp: 30 tablet, Rfl: 0   cetirizine (ZYRTEC) 10 MG chewable tablet, Chew 10 mg by mouth daily., Disp: , Rfl:    ibuprofen (ADVIL) 600 MG tablet, Take 1 tablet (600 mg total) by mouth every 6 (six) hours as needed., Disp: 30 tablet, Rfl: 0   naproxen (NAPROSYN) 500 MG tablet, Take by mouth., Disp: , Rfl:    oxyCODONE (OXY IR/ROXICODONE) 5 MG immediate release tablet,  Take 1 tablet (5 mg total) by mouth every 4 (four) hours as needed for severe pain (pain score 7-10)., Disp: 15 tablet, Rfl: 0   polyethylene glycol powder (GLYCOLAX/MIRALAX) 17 GM/SCOOP powder, Take 17 g by mouth daily. Drink 17g (1 scoop) dissolved in water per day., Disp: 255 g, Rfl: 0   tirzepatide (ZEPBOUND) 2.5 MG/0.5ML Pen, Inject 2.5 mg into the skin once a week., Disp: 2 mL, Rfl: 0   Vitamin D, Ergocalciferol, (DRISDOL) 1.25 MG (50000 UT) CAPS capsule, Take 1 capsule (50,000 Units total) by mouth every 7 (seven) days., Disp: 12 capsule, Rfl: 3   Objective There were  no vitals filed for this visit.   Gen: NAD CV: S1 S2 RRR Lungs: Clear to auscultation bilaterally Abd: soft, nontender   Previous Pelvic Exam showed: Normal external genitalia. On speculum, normal vaginal mucosa and normal appearing cervix. On bimanual, uterus is small, mobile and nontender.    Prior exam showed:   POP-Q   -1                                            Aa   -1                                           Ba   -5                                              C    4.5                                            Gh   3.5                                            Pb   8                                            tvl    -2.5                                            Ap   -2.5                                            Bp   -6.5                                                Assessment/ Plan  Assessment:  The patient is a 43 y.o. year old scheduled to undergo Exam under anesthesia, robotic assisted total laparoscopic hysterectomy with bilateral salpingectomy, sacrocolpopexy, midurethral sling, cystoscopy, possible perineorrhaphy. Verbal consent was obtained for these procedures.

## 2023-10-07 NOTE — Progress Notes (Signed)
[facility] Pre-Operative Exam  Subjective Chief Complaint: Jamie Meza presents for a preoperative encounter.   History of Present Illness: Jamie Meza is a 42 y.o. female who presents for preoperative visit.  She is scheduled to undergo Exam under anesthesia, robotic assisted total laparoscopic hysterectomy with bilateral salpingectomy, sacrocolpopexy, midurethral sling, cystoscopy, possible perineorrhaphy  on 10/20/23.  Her symptoms include pelvic organ prolapse and stress urinary incontinence, and she was was found to have Stage II anterior, Stage I posterior, Stage I apical prolapse.   Simple CMG showed: First sensation: First Desire: Strong Desire: Capacity: Cough stress test was negative. Valsalva stress test was positive. Small DO noted at capacity.  Catheter was replaced to drain bladder.    Past Medical History:  Diagnosis Date   Recurrent UTI    occur yearly     Past Surgical History:  Procedure Laterality Date   IR GENERIC HISTORICAL  08/22/2016   IR ANGIOGRAM SELECTIVE EACH ADDITIONAL VESSEL 08/22/2016 Berdine Dance, MD [facility]   IR GENERIC HISTORICAL  08/22/2016   IR ANGIOGRAM SELECTIVE EACH ADDITIONAL VESSEL 08/22/2016 Berdine Dance, MD [facility]   IR GENERIC HISTORICAL  08/22/2016   IR ANGIOGRAM PELVIS SELECTIVE OR SUPRASELECTIVE 08/22/2016 Berdine Dance, MD [facility]   IR GENERIC HISTORICAL  08/22/2016   IR US GUIDE VASC ACCESS RIGHT 08/22/2016 Berdine Dance, MD [facility]   IR GENERIC HISTORICAL  08/22/2016   IR ANGIOGRAM PELVIS SELECTIVE OR SUPRASELECTIVE 08/22/2016 Berdine Dance, MD [facility]   IR GENERIC HISTORICAL  08/22/2016   IR EMBO ARTERIAL NOT HEMORR HEMANG INC GUIDE ROADMAPPING 08/22/2016 Berdine Dance, MD [facility]   IR GENERIC HISTORICAL  06/17/2016   IR RADIOLOGIST EVAL & MGMT 06/17/2016 Berdine Dance, MD [facility]   IR GENERIC HISTORICAL  09/10/2016   IR RADIOLOGIST EVAL & MGMT  09/10/2016 [facility]   TUBAL LIGATION  2006    has No Known Allergies.   Family History  Problem Relation Age of Onset   Breast cancer Mother 40   Heart attack Father    Breast cancer Maternal Aunt 34   Breast cancer Maternal Aunt 45       BRCA negative   Prostate cancer Maternal Uncle    Prostate cancer Maternal Uncle    Stroke Maternal Grandmother    Diabetes Maternal Grandmother    Cancer Cousin 40       colon cancer    Social History   Tobacco Use   Smoking status: Never   Smokeless tobacco: Never  Vaping Use   Vaping status: Never Used  Substance Use Topics   Alcohol use: Yes    Comment: socially   Drug use: No     Review of Systems was negative for a full 10 system review except as noted in the History of Present Illness.   Current Outpatient Medications:    acetaminophen (TYLENOL) 500 MG tablet, Take 1 tablet (500 mg total) by mouth every 6 (six) hours as needed (pain)., Disp: 30 tablet, Rfl: 0   cetirizine (ZYRTEC) 10 MG chewable tablet, Chew 10 mg by mouth daily., Disp: , Rfl:    ibuprofen (ADVIL) 600 MG tablet, Take 1 tablet (600 mg total) by mouth every 6 (six) hours as needed., Disp: 30 tablet, Rfl: 0   naproxen (NAPROSYN) 500 MG tablet, Take by mouth., Disp: , Rfl:    oxyCODONE (OXY IR/ROXICODONE) 5 MG immediate release tablet, Take 1 tablet (5 mg total)  by mouth every 4 (four) hours as needed for severe pain (pain score 7-10)., Disp: 15 tablet, Rfl: 0   polyethylene glycol powder (GLYCOLAX/MIRALAX) 17 GM/SCOOP powder, Take 17 g by mouth daily. Drink 17g (1 scoop) dissolved in water per day., Disp: 255 g, Rfl: 0   tirzepatide (ZEPBOUND) 2.5 MG/0.5ML Pen, Inject 2.5 mg into the skin once a week., Disp: 2 mL, Rfl: 0   Vitamin D, Ergocalciferol, (DRISDOL) 1.25 MG (50000 UT) CAPS capsule, Take 1 capsule (50,000 Units total) by mouth every 7 (seven) days., Disp: 12 capsule, Rfl: 3   Objective Vitals:   10/07/23 0826  BP: 102/72  Pulse: 66    Gen:  NAD CV: S1 S2 RRR Lungs: Clear to auscultation bilaterally Abd: soft, nontender   Previous Pelvic Exam showed: Normal external genitalia. On speculum, normal vaginal mucosa and normal appearing cervix. On bimanual, uterus is small, mobile and nontender.    Prior exam showed:   POP-Q   -1                                            Aa   -1                                           Ba   -5                                              C    4.5                                            Gh   3.5                                            Pb   8                                            tvl    -2.5                                            Ap   -2.5                                            Bp   -6.5                                                Assessment/ Plan  Assessment: The patient  is a 42 y.o. year old scheduled to undergo Exam under anesthesia, robotic assisted total laparoscopic hysterectomy with bilateral salpingectomy, sacrocolpopexy, midurethral sling, cystoscopy, possible perineorrhaphy. Verbal consent was obtained for these procedures.  Plan: General Surgical Consent: The patient has previously been counseled on alternative treatments, and the decision by the patient and provider was to proceed with the procedure listed above.  For all procedures, there are risks of bleeding, infection, damage to surrounding organs including but not limited to bowel, bladder, blood vessels, ureters and nerves, and need for further surgery if an injury were to occur. These risks are all low with minimally invasive surgery.   There are risks of numbness and weakness at any body site or buttock/rectal pain.  It is possible that baseline pain can be worsened by surgery, either with or without mesh. If surgery is vaginal, there is also a low risk of possible conversion to laparoscopy or open abdominal incision where indicated. Very rare risks include blood transfusion, blood  clot, heart attack, pneumonia, or death.   There is also a risk of short-term postoperative urinary retention with need to use a catheter. About half of patients need to go home from surgery with a catheter, which is then later removed in the office. The risk of long-term need for a catheter is very low. There is also a risk of worsening of overactive bladder.   Sling: The effectiveness of a midurethral vaginal mesh sling is approximately 85%, and thus, there will be times when you may leak urine after surgery, especially if your bladder is full or if you have a strong cough. There is a balance between making the sling tight enough to treat your leakage but not too tight so that you have long-term difficulty emptying your bladder. A mesh sling will not directly treat overactive bladder/urge incontinence and may worsen it.  There is an FDA safety notification on vaginal mesh procedures for prolapse but NOT mesh slings. We have extensive experience and training with mesh placement and we have close postoperative follow up to identify any potential complications from mesh. It is important to realize that this mesh is a permanent implant that cannot be easily removed. There are rare risks of mesh exposure (2-4%), pain with intercourse (0-7%), and infection (<1%). The risk of mesh exposure if more likely in a woman with risks for poor healing (prior radiation, poorly controlled diabetes, or immunocompromised). The risk of new or worsened chronic pain after mesh implant is more common in women with baseline chronic pain and/or poorly controlled anxiety or depression. Approximately 2-4% of patients will experience longer-term post-operative voiding dysfunction that may require surgical revision of the sling. We also reviewed that postoperatively, her stream may not be as strong as before surgery.   Prolapse (with or without mesh): Risk factors for surgical failure  include things that put pressure on your pelvis  and the surgical repair, including obesity, chronic cough, and heavy lifting or straining (including lifting children or adults, straining on the toilet, or lifting heavy objects such as furniture or anything weighing >25 lbs. Risks of recurrence is 20-30% with vaginal native tissue repair and a less than 10% with sacrocolpopexy with mesh.    Sacrocolpopexy: Mesh implants may provide more prolapse support, but do have some unique risks to consider. It is important to understand that mesh is permanent and cannot be easily removed. Risks of abdominal sacrocolpopexy mesh include mesh exposure (~3-6%), painful intercourse (recent studies show lower rates after surgery compared to  before, with ~5-8% risk of new onset), and very rare risks of bowel or bladder injury or infection (<1%). The risk of mesh exposure is more likely in a woman with risks for poor healing (prior radiation, poorly controlled diabetes, or immunocompromised). The risk of new or worsened chronic pain after mesh implant is more common in women with baseline chronic pain and/or poorly controlled anxiety or depression. There is an FDA safety notification on vaginal mesh procedures for prolapse but NOT abdominal mesh procedures and therefore does not apply to your surgery. We have extensive experience and training with mesh placement and we have close postoperative follow up to identify any potential complications from mesh.    We discussed consent for blood products. Risks for blood transfusion include allergic reactions, other reactions that can affect different body organs and managed accordingly, transmission of infectious diseases such as HIV or Hepatitis. However, the blood is screened. Patient consents for blood products.  Pre-operative instructions:  She was instructed to not take Aspirin/NSAIDs x 7days prior to surgery. Antibiotic prophylaxis was ordered as indicated.  Catheter use: Patient will go home with foley if needed after  post-operative voiding trial.  Post-operative instructions:  She was provided with specific post-operative instructions, including precautions and signs/symptoms for which we would recommend contacting us, in addition to daytime and after-hours contact phone numbers. This was provided on a handout.   Post-operative medications: Prescriptions for motrin, tylenol, miralax, and oxycodone were sent to her pharmacy. Discussed using ibuprofen and tylenol on a schedule to limit use of narcotics.   Laboratory testing:  We will check labs: CBC and Type and Screen.   Preoperative clearance:  She does not require surgical clearance.    Post-operative follow-up:  A post-operative appointment will be made for 6 weeks from the date of surgery. If she needs a post-operative nurse visit for a voiding trial, that will be set up after she leaves the hospital.    Patient will call the clinic or use MyChart should anything change or any new issues arise.   Selmer Dominion, NP

## 2023-10-07 NOTE — Telephone Encounter (Signed)
Spoke with pt and she stated wants to do the genetic testing.  I explained to pt that the testing covered most of the major cancer genes.  I also stated she was negative for all of them.  She stated she was also tested prior to 2013 but does not have record of the results.  Pt stated she would like to DO the genetic testing due to this.  Notified Dr. Mosetta Putt and Maylon Cos of the pt's decision to proceed with genetics testing.

## 2023-10-09 ENCOUNTER — Encounter (HOSPITAL_BASED_OUTPATIENT_CLINIC_OR_DEPARTMENT_OTHER): Payer: Self-pay | Admitting: Obstetrics and Gynecology

## 2023-10-20 DIAGNOSIS — D509 Iron deficiency anemia, unspecified: Secondary | ICD-10-CM

## 2023-11-09 ENCOUNTER — Encounter: Payer: Self-pay | Admitting: Genetic Counselor

## 2023-11-09 ENCOUNTER — Inpatient Hospital Stay: Payer: No Typology Code available for payment source

## 2023-11-09 ENCOUNTER — Inpatient Hospital Stay: Payer: No Typology Code available for payment source | Attending: Hematology | Admitting: Genetic Counselor

## 2023-12-04 ENCOUNTER — Encounter: Payer: No Typology Code available for payment source | Admitting: Obstetrics and Gynecology

## 2023-12-30 ENCOUNTER — Ambulatory Visit: Payer: 59 | Admitting: Internal Medicine

## 2023-12-30 ENCOUNTER — Encounter: Payer: Self-pay | Admitting: Internal Medicine

## 2023-12-30 VITALS — BP 108/64 | HR 84 | Temp 98.3°F | Ht 66.0 in | Wt 206.8 lb

## 2023-12-30 DIAGNOSIS — Z0000 Encounter for general adult medical examination without abnormal findings: Secondary | ICD-10-CM | POA: Diagnosis not present

## 2023-12-30 DIAGNOSIS — Z6833 Body mass index (BMI) 33.0-33.9, adult: Secondary | ICD-10-CM | POA: Diagnosis not present

## 2023-12-30 DIAGNOSIS — Z803 Family history of malignant neoplasm of breast: Secondary | ICD-10-CM

## 2023-12-30 DIAGNOSIS — Z113 Encounter for screening for infections with a predominantly sexual mode of transmission: Secondary | ICD-10-CM

## 2023-12-30 DIAGNOSIS — F40240 Claustrophobia: Secondary | ICD-10-CM | POA: Diagnosis not present

## 2023-12-30 DIAGNOSIS — E66811 Obesity, class 1: Secondary | ICD-10-CM

## 2023-12-30 DIAGNOSIS — Z1239 Encounter for other screening for malignant neoplasm of breast: Secondary | ICD-10-CM

## 2023-12-30 DIAGNOSIS — E6609 Other obesity due to excess calories: Secondary | ICD-10-CM

## 2023-12-30 MED ORDER — ZEPBOUND 2.5 MG/0.5ML ~~LOC~~ SOAJ: 2.5000 mg | SUBCUTANEOUS | 0 refills | Status: DC

## 2023-12-30 NOTE — Progress Notes (Signed)
I,Victoria T Deloria Lair, CMA,acting as a Neurosurgeon for Gwynneth Aliment, MD.,have documented all relevant documentation on the behalf of Gwynneth Aliment, MD,as directed by  Gwynneth Aliment, MD while in the presence of Gwynneth Aliment, MD.  Subjective:    Patient ID: Jamie Meza , female    DOB: [birth year] , 43 y.o.   MRN: [id number]  Chief Complaint  Patient presents with   Annual Exam    HPI  Patient presents today for annual exam. She reports compliance with medications. Denies having any headache, chest pain & sob. Gyn: Dr Cherly Hensen.  She has no specific questions or concerns.       Past Medical History:  Diagnosis Date   Recurrent UTI    occur yearly     Family History  Problem Relation Age of Onset   Breast cancer Mother 15   Heart attack Father    Breast cancer Maternal Aunt 34   Breast cancer Maternal Aunt 45       BRCA negative   Prostate cancer Maternal Uncle    Prostate cancer Maternal Uncle    Stroke Maternal Grandmother    Diabetes Maternal Grandmother    Cancer Cousin 64       colon cancer     Current Outpatient Medications:    cetirizine (ZYRTEC) 10 MG chewable tablet, Chew 10 mg by mouth daily., Disp: , Rfl:    ibuprofen (ADVIL) 600 MG tablet, Take 1 tablet (600 mg total) by mouth every 6 (six) hours as needed., Disp: 30 tablet, Rfl: 0   naproxen (NAPROSYN) 500 MG tablet, Take by mouth., Disp: , Rfl:    polyethylene glycol powder (GLYCOLAX/MIRALAX) 17 GM/SCOOP powder, Take 17 g by mouth daily. Drink 17g (1 scoop) dissolved in water per day., Disp: 255 g, Rfl: 0   Vitamin D, Ergocalciferol, (DRISDOL) 1.25 MG (50000 UT) CAPS capsule, Take 1 capsule (50,000 Units total) by mouth every 7 (seven) days., Disp: 12 capsule, Rfl: 3   acetaminophen (TYLENOL) 500 MG tablet, Take 1 tablet (500 mg total) by mouth every 6 (six) hours as needed (pain). (Patient not taking: Reported on 12/30/2023), Disp: 30 tablet, Rfl: 0   tirzepatide (ZEPBOUND) 2.5 MG/0.5ML Pen, Inject 2.5 mg  into the skin once a week., Disp: 2 mL, Rfl: 0   No Known Allergies    The patient states she uses tubal ligation] for birth control. Patient's last menstrual period was 12/20/2023 (exact date).. Negative for Menorrhagia. Negative for: breast discharge, breast lump(s), breast pain and breast self exam. Associated symptoms include abnormal vaginal bleeding. Pertinent negatives include abnormal bleeding (hematology), anxiety, decreased libido, depression, difficulty falling sleep, dyspareunia, history of infertility, nocturia, sexual dysfunction, sleep disturbances, urinary incontinence, urinary urgency, vaginal discharge and vaginal itching. Diet regular.The patient states her exercise level is    . The patient's tobacco use is:  Social History   Tobacco Use  Smoking Status Never  Smokeless Tobacco Never  . She has been exposed to passive smoke. The patient's alcohol use is:  Social History   Substance and Sexual Activity  Alcohol Use Yes   Comment: socially    Review of Systems  Constitutional:  Positive for unexpected weight change.  HENT: Negative.  Negative for dental problem.   Eyes: Negative.   Respiratory: Negative.    Cardiovascular: Negative.   Gastrointestinal: Negative.   Endocrine: Negative.   Genitourinary: Negative.   Musculoskeletal: Negative.   Skin: Negative.   Allergic/Immunologic: Negative.   Neurological: Negative.  Hematological: Negative.   Psychiatric/Behavioral: Negative.       Today's Vitals   12/30/23 1519  BP: 108/64  Pulse: 84  Temp: 98.3 F (36.8 C)  SpO2: 98%  Weight: 206 lb 12.8 oz (93.8 kg)  Height: 5\' 6"  (1.676 m)   Body mass index is 33.38 kg/m.  Wt Readings from Last 3 Encounters:  12/30/23 206 lb 12.8 oz (93.8 kg)  10/07/23 202 lb (91.6 kg)  09/14/23 199 lb 8 oz (90.5 kg)     Objective:  Physical Exam Vitals and nursing note reviewed.  Constitutional:      Appearance: Normal appearance.  HENT:     Head: Normocephalic and  atraumatic.     Right Ear: Tympanic membrane, ear canal and external ear normal.     Left Ear: Tympanic membrane, ear canal and external ear normal.     Nose: Nose normal.     Mouth/Throat:     Mouth: Mucous membranes are moist.     Pharynx: Oropharynx is clear.  Eyes:     Extraocular Movements: Extraocular movements intact.     Conjunctiva/sclera: Conjunctivae normal.     Pupils: Pupils are equal, round, and reactive to light.  Cardiovascular:     Rate and Rhythm: Normal rate and regular rhythm.     Pulses: Normal pulses.     Heart sounds: Normal heart sounds.  Pulmonary:     Effort: Pulmonary effort is normal.     Breath sounds: Normal breath sounds.  Chest:  Breasts:    Tanner Score is 5.     Right: Normal.     Left: Normal.  Abdominal:     General: Abdomen is flat. Bowel sounds are normal.     Palpations: Abdomen is soft.  Genitourinary:    Comments: deferred Musculoskeletal:        General: Normal range of motion.     Cervical back: Normal range of motion and neck supple.  Skin:    General: Skin is warm and dry.  Neurological:     General: No focal deficit present.     Mental Status: She is alert and oriented to person, place, and time.  Psychiatric:        Mood and Affect: Mood normal.        Behavior: Behavior normal.         Assessment And Plan:     Encounter for annual health examination Assessment & Plan: A full exam was performed.  Importance of monthly self breast exams was discussed with the patient.  She is advised to get 30-45 minutes of regular exercise, no less than four to five days per week. Both weight-bearing and aerobic exercises are recommended.  She is advised to follow a healthy diet with at least six fruits/veggies per day, decrease intake of red meat and other saturated fats and to increase fish intake to twice weekly.  Meats/fish should not be fried -- baked, boiled or broiled is preferable. It is also important to cut back on your sugar  intake.  Be sure to read labels - try to avoid anything with added sugar, high fructose corn syrup or other sweeteners.  If you must use a sweetener, you can try stevia or monkfruit.  It is also important to avoid artificially sweetened foods/beverages and diet drinks. Lastly, wear SPF 50 sunscreen on exposed skin and when in direct sunlight for an extended period of time.  Be sure to avoid fast food restaurants and aim for at least  60 ounces of water daily.       Claustrophobia Assessment & Plan: She was given rx diazepam by Oncology. PDMP reviewed. She has not taken the medication yet. She will take one hour prior to procedure and repeat if needed.  She was advised to have someone drive her for this testing.    Class 1 obesity due to excess calories with serious comorbidity and body mass index (BMI) of 33.0 to 33.9 in adult Assessment & Plan: She is encouraged to strive for BMI less than 30 to decrease cardiac risk. Advised to aim for at least 150 minutes of exercise per week.  We discussed the use of Zepbound in treating obesity. She denies family history of thyroid cancer. Will send rx to start PA process. Once approved, will teach patient how to self administer the medication. I will also refer her to [facility] for weight management. She is in agreement with the above treatment plan.   Orders: -     Amb Ref to Medical Weight Management  Family history of breast cancer in first degree relative Assessment & Plan: Most recent Oncology notes reviewed. I will refer her for MRI breast bilateral.   Orders: -     MR BREAST BILATERAL W WO CONTRAST INC CAD; Future  Encounter for breast cancer screening using non-mammogram modality -     MR BREAST BILATERAL W WO CONTRAST INC CAD; Future  Screening examination for STD (sexually transmitted disease) -     HIV Antibody (routine testing w rflx) -     Hepatitis C antibody  Other orders -     Zepbound; Inject 2.5 mg into the skin once a week.   Dispense: 2 mL; Refill: 0  She is encouraged to strive for BMI less than 30 to decrease cardiac risk. Advised to aim for at least 150 minutes of exercise per week.    Return for 1 YEAR HM, . Patient was given opportunity to ask questions. Patient verbalized understanding of the plan and was able to repeat key elements of the plan. All questions were answered to their satisfaction.    I, Gwynneth Aliment, MD, have reviewed all documentation for this visit. The documentation on 12/30/23 for the exam, diagnosis, procedures, and orders are all accurate and complete.

## 2023-12-30 NOTE — Patient Instructions (Signed)

## 2023-12-30 NOTE — Assessment & Plan Note (Signed)

## 2023-12-31 LAB — HEPATITIS C ANTIBODY: Hep C Virus Ab: NONREACTIVE

## 2023-12-31 LAB — HIV ANTIBODY (ROUTINE TESTING W REFLEX): HIV Screen 4th Generation wRfx: NONREACTIVE

## 2024-01-02 NOTE — Assessment & Plan Note (Addendum)
She is encouraged to strive for BMI less than 30 to decrease cardiac risk. Advised to aim for at least 150 minutes of exercise per week.  We discussed the use of Zepbound in treating obesity. She denies family history of thyroid cancer. Will send rx to start PA process. Once approved, will teach patient how to self administer the medication. I will also refer her to [facility] for weight management. She is in agreement with the above treatment plan.

## 2024-01-02 NOTE — Assessment & Plan Note (Signed)
She was given rx diazepam by Oncology. PDMP reviewed. She has not taken the medication yet. She will take one hour prior to procedure and repeat if needed.  She was advised to have someone drive her for this testing.

## 2024-01-02 NOTE — Assessment & Plan Note (Signed)
Most recent Oncology notes reviewed. I will refer her for MRI breast bilateral.

## 2024-01-07 ENCOUNTER — Ambulatory Visit: Payer: 59 | Admitting: Obstetrics and Gynecology

## 2024-01-07 VITALS — BP 106/69 | HR 73 | Wt 208.4 lb

## 2024-01-07 DIAGNOSIS — N812 Incomplete uterovaginal prolapse: Secondary | ICD-10-CM

## 2024-01-12 ENCOUNTER — Encounter (HOSPITAL_BASED_OUTPATIENT_CLINIC_OR_DEPARTMENT_OTHER): Payer: Self-pay | Admitting: Obstetrics and Gynecology

## 2024-01-19 ENCOUNTER — Ambulatory Visit (HOSPITAL_BASED_OUTPATIENT_CLINIC_OR_DEPARTMENT_OTHER): Admission: RE | Admit: 2024-01-19 | Payer: 59 | Source: Home / Self Care | Admitting: Obstetrics and Gynecology

## 2024-01-19 DIAGNOSIS — D509 Iron deficiency anemia, unspecified: Secondary | ICD-10-CM

## 2024-01-19 HISTORY — DX: Female genital prolapse, unspecified: N81.9

## 2024-01-19 HISTORY — DX: Other allergic rhinitis: J30.89

## 2024-01-19 HISTORY — DX: Overactive bladder: N32.81

## 2024-01-19 HISTORY — DX: Claustrophobia: F40.240

## 2024-01-19 HISTORY — DX: Stress incontinence (female) (male): N39.3

## 2024-01-19 HISTORY — DX: Family history of malignant neoplasm of breast: Z80.3

## 2024-01-19 HISTORY — DX: Incomplete uterovaginal prolapse: N81.2

## 2024-01-19 HISTORY — DX: Personal history of urinary (tract) infections: Z87.440

## 2024-01-19 HISTORY — DX: Leiomyoma of uterus, unspecified: D25.9

## 2024-01-19 SURGERY — HYSTERECTOMY, TOTAL, ROBOT-ASSISTED, WITH SACROCOLPOPEXY
Anesthesia: General

## 2024-02-06 ENCOUNTER — Other Ambulatory Visit: Payer: Self-pay

## 2024-03-01 ENCOUNTER — Encounter: Payer: No Typology Code available for payment source | Admitting: Obstetrics and Gynecology

## 2024-03-02 ENCOUNTER — Encounter: Payer: No Typology Code available for payment source | Admitting: Obstetrics and Gynecology

## 2024-03-06 ENCOUNTER — Other Ambulatory Visit: Payer: Self-pay

## 2024-04-10 ENCOUNTER — Inpatient Hospital Stay: Admission: RE | Admit: 2024-04-10 | Payer: Self-pay | Source: Ambulatory Visit

## 2024-06-13 ENCOUNTER — Ambulatory Visit: Payer: 59 | Admitting: Obstetrics and Gynecology

## 2024-06-15 ENCOUNTER — Ambulatory Visit: Payer: 59 | Admitting: Obstetrics and Gynecology

## 2024-06-21 ENCOUNTER — Ambulatory Visit: Payer: 59 | Admitting: Obstetrics and Gynecology

## 2024-08-17 ENCOUNTER — Encounter: Payer: Self-pay | Admitting: Internal Medicine

## 2024-08-17 DIAGNOSIS — Z1231 Encounter for screening mammogram for malignant neoplasm of breast: Secondary | ICD-10-CM

## 2025-01-05 ENCOUNTER — Encounter: Payer: Self-pay | Admitting: Internal Medicine

## 2025-01-05 ENCOUNTER — Ambulatory Visit: Payer: 59 | Admitting: Internal Medicine

## 2025-01-05 VITALS — BP 118/60 | HR 93 | Temp 97.6°F | Ht 66.0 in | Wt 207.0 lb

## 2025-01-05 DIAGNOSIS — Z6833 Body mass index (BMI) 33.0-33.9, adult: Secondary | ICD-10-CM

## 2025-01-05 DIAGNOSIS — N951 Menopausal and female climacteric states: Secondary | ICD-10-CM | POA: Diagnosis not present

## 2025-01-05 DIAGNOSIS — Z0000 Encounter for general adult medical examination without abnormal findings: Secondary | ICD-10-CM | POA: Diagnosis not present

## 2025-01-05 DIAGNOSIS — E6609 Other obesity due to excess calories: Secondary | ICD-10-CM

## 2025-01-05 DIAGNOSIS — Z803 Family history of malignant neoplasm of breast: Secondary | ICD-10-CM

## 2025-01-05 DIAGNOSIS — E66811 Obesity, class 1: Secondary | ICD-10-CM | POA: Diagnosis not present

## 2025-01-05 DIAGNOSIS — Z2821 Immunization not carried out because of patient refusal: Secondary | ICD-10-CM | POA: Diagnosis not present

## 2025-01-05 DIAGNOSIS — E559 Vitamin D deficiency, unspecified: Secondary | ICD-10-CM | POA: Diagnosis not present

## 2025-01-05 DIAGNOSIS — F5104 Psychophysiologic insomnia: Secondary | ICD-10-CM | POA: Diagnosis not present

## 2025-01-05 NOTE — Patient Instructions (Signed)

## 2025-01-05 NOTE — Assessment & Plan Note (Signed)

## 2025-01-06 LAB — CMP14+EGFR
ALT: 26 [IU]/L (ref 0–32)
AST: 34 [IU]/L (ref 0–40)
Albumin: 4.8 g/dL (ref 3.9–4.9)
Alkaline Phosphatase: 77 [IU]/L (ref 41–116)
BUN/Creatinine Ratio: 16 (ref 9–23)
BUN: 13 mg/dL (ref 6–24)
Bilirubin Total: 0.4 mg/dL (ref 0.0–1.2)
CO2: 21 mmol/L (ref 20–29)
Calcium: 9.9 mg/dL (ref 8.7–10.2)
Chloride: 101 mmol/L (ref 96–106)
Creatinine, Ser: 0.82 mg/dL (ref 0.57–1.00)
Globulin, Total: 2.7 g/dL (ref 1.5–4.5)
Glucose: 80 mg/dL (ref 70–99)
Potassium: 4.3 mmol/L (ref 3.5–5.2)
Sodium: 138 mmol/L (ref 134–144)
Total Protein: 7.5 g/dL (ref 6.0–8.5)
eGFR: 90 mL/min/{1.73_m2}

## 2025-01-06 LAB — CBC
Hematocrit: 36.8 % (ref 34.0–46.6)
Hemoglobin: 11.9 g/dL (ref 11.1–15.9)
MCH: 30.8 pg (ref 26.6–33.0)
MCHC: 32.3 g/dL (ref 31.5–35.7)
MCV: 95 fL (ref 79–97)
Platelets: 291 10*3/uL (ref 150–450)
RBC: 3.86 x10E6/uL (ref 3.77–5.28)
RDW: 13.3 % (ref 11.7–15.4)
WBC: 11.1 10*3/uL — ABNORMAL HIGH (ref 3.4–10.8)

## 2025-01-06 LAB — VITAMIN D 25 HYDROXY (VIT D DEFICIENCY, FRACTURES): Vit D, 25-Hydroxy: 21 ng/mL — ABNORMAL LOW (ref 30.0–100.0)

## 2025-01-06 LAB — LIPID PANEL
Chol/HDL Ratio: 2.3 ratio (ref 0.0–4.4)
Cholesterol, Total: 216 mg/dL — ABNORMAL HIGH (ref 100–199)
HDL: 94 mg/dL
LDL Chol Calc (NIH): 114 mg/dL — ABNORMAL HIGH (ref 0–99)
Triglycerides: 44 mg/dL (ref 0–149)
VLDL Cholesterol Cal: 8 mg/dL (ref 5–40)

## 2025-01-06 LAB — TSH: TSH: 3.11 u[IU]/mL (ref 0.450–4.500)

## 2025-01-07 ENCOUNTER — Ambulatory Visit: Payer: Self-pay | Admitting: Internal Medicine

## 2025-01-07 MED ORDER — VITAMIN D (ERGOCALCIFEROL) 1.25 MG (50000 UNIT) PO CAPS: 50000.0000 [IU] | ORAL_CAPSULE | ORAL | 2 refills | Status: AC

## 2025-01-08 DIAGNOSIS — N951 Menopausal and female climacteric states: Secondary | ICD-10-CM | POA: Insufficient documentation

## 2025-01-08 MED ORDER — DIAZEPAM 5 MG PO TABS: 5.0000 mg | ORAL_TABLET | Freq: Once | ORAL | 0 refills | Status: AC

## 2025-01-08 NOTE — Assessment & Plan Note (Signed)
 High-risk due to family history. She is thinking about bilateral mastectomy with reconstruction due to dense breast tissue and family history.  - Ordered bilateral breast MRI with pre-procedure medication. - Consider referral to plastic surgeon for consultation.

## 2025-01-08 NOTE — Assessment & Plan Note (Signed)
 Managed with supplementation. Discussed importance for breast cancer risk. - Checked vitamin D  levels.

## 2025-01-08 NOTE — Assessment & Plan Note (Signed)
 She reports having frequent nocturnal awakenings. - Recommended magnesium glycinate at night.

## 2025-01-08 NOTE — Assessment & Plan Note (Signed)
 Irregular cycles, hot flashes, night sweats, mood changes, and fatigue. Symptoms improved with supplements. No medication requested for mood or hormones. - Checked thyroid  function. - Recommended magnesium glycinate at night. - Continue vitamin D , ashwagandha, and black cohosh.

## 2025-01-08 NOTE — Assessment & Plan Note (Signed)
 She is encouraged to strive for BMI less than 30 to decrease cardiac risk. Advised to aim for at least 150 minutes of exercise per week.

## 2026-01-09 ENCOUNTER — Encounter: Payer: Self-pay | Admitting: Internal Medicine
